# Patient Record
Sex: Female | Born: 1940
Health system: Southern US, Community
[De-identification: ages and names within clinical notes are randomized; demographics above are authoritative.]

## PROBLEM LIST (undated history)

## (undated) DIAGNOSIS — M5430 Sciatica, unspecified side: Secondary | ICD-10-CM

## (undated) DIAGNOSIS — K219 Gastro-esophageal reflux disease without esophagitis: Secondary | ICD-10-CM

## (undated) DIAGNOSIS — H269 Unspecified cataract: Secondary | ICD-10-CM

## (undated) DIAGNOSIS — K589 Irritable bowel syndrome without diarrhea: Secondary | ICD-10-CM

## (undated) DIAGNOSIS — T7840XA Allergy, unspecified, initial encounter: Secondary | ICD-10-CM

## (undated) DIAGNOSIS — M8580 Other specified disorders of bone density and structure, unspecified site: Secondary | ICD-10-CM

## (undated) DIAGNOSIS — G459 Transient cerebral ischemic attack, unspecified: Secondary | ICD-10-CM

## (undated) DIAGNOSIS — S92491D Other fracture of right great toe, subsequent encounter for fracture with routine healing: Secondary | ICD-10-CM

## (undated) DIAGNOSIS — H579 Unspecified disorder of eye and adnexa: Secondary | ICD-10-CM

## (undated) DIAGNOSIS — E785 Hyperlipidemia, unspecified: Secondary | ICD-10-CM

## (undated) DIAGNOSIS — M1990 Unspecified osteoarthritis, unspecified site: Secondary | ICD-10-CM

## (undated) DIAGNOSIS — F419 Anxiety disorder, unspecified: Secondary | ICD-10-CM

## (undated) DIAGNOSIS — K635 Polyp of colon: Secondary | ICD-10-CM

## (undated) DIAGNOSIS — I10 Essential (primary) hypertension: Secondary | ICD-10-CM

## (undated) HISTORY — DX: Other fracture of right great toe, subsequent encounter for fracture with routine healing: S92.491D

## (undated) HISTORY — DX: Hyperlipidemia, unspecified: E78.5

## (undated) HISTORY — DX: Allergy, unspecified, initial encounter: T78.40XA

## (undated) HISTORY — PX: ENDOMETRIAL ABLATION: SHX621

## (undated) HISTORY — DX: Gastro-esophageal reflux disease without esophagitis: K21.9

## (undated) HISTORY — DX: Essential (primary) hypertension: I10

## (undated) HISTORY — DX: Polyp of colon: K63.5

## (undated) HISTORY — DX: Sciatica, unspecified side: M54.30

## (undated) HISTORY — PX: POLYPECTOMY: SHX149

## (undated) HISTORY — DX: Other specified disorders of bone density and structure, unspecified site: M85.80

## (undated) HISTORY — DX: Unspecified osteoarthritis, unspecified site: M19.90

## (undated) HISTORY — DX: Anxiety disorder, unspecified: F41.9

## (undated) HISTORY — DX: Transient cerebral ischemic attack, unspecified: G45.9

## (undated) HISTORY — PX: APPENDECTOMY: SHX54

## (undated) HISTORY — DX: Irritable bowel syndrome, unspecified: K58.9

## (undated) HISTORY — PX: DILATION AND CURETTAGE OF UTERUS: SHX78

## (undated) HISTORY — PX: TONSILLECTOMY: SUR1361

## (undated) HISTORY — DX: Unspecified disorder of eye and adnexa: H57.9

## (undated) HISTORY — DX: Unspecified cataract: H26.9

## (undated) HISTORY — PX: COLONOSCOPY: SHX174

## (undated) HISTORY — PX: EXPLORATORY LAPAROTOMY: SUR591

---

## 1998-06-13 ENCOUNTER — Other Ambulatory Visit: Admission: RE | Admit: 1998-06-13 | Discharge: 1998-06-13 | Payer: Self-pay | Admitting: Obstetrics and Gynecology

## 1998-12-25 ENCOUNTER — Other Ambulatory Visit: Admission: RE | Admit: 1998-12-25 | Discharge: 1998-12-25 | Payer: Self-pay | Admitting: Obstetrics and Gynecology

## 1999-09-06 ENCOUNTER — Other Ambulatory Visit: Admission: RE | Admit: 1999-09-06 | Discharge: 1999-09-06 | Payer: Self-pay | Admitting: Obstetrics and Gynecology

## 2000-09-10 ENCOUNTER — Other Ambulatory Visit: Admission: RE | Admit: 2000-09-10 | Discharge: 2000-09-10 | Payer: Self-pay | Admitting: Obstetrics and Gynecology

## 2001-05-14 ENCOUNTER — Ambulatory Visit (HOSPITAL_COMMUNITY): Admission: RE | Admit: 2001-05-14 | Discharge: 2001-05-14 | Payer: Self-pay | Admitting: Obstetrics and Gynecology

## 2001-05-14 ENCOUNTER — Encounter (INDEPENDENT_AMBULATORY_CARE_PROVIDER_SITE_OTHER): Payer: Self-pay | Admitting: *Deleted

## 2001-12-10 ENCOUNTER — Emergency Department (HOSPITAL_COMMUNITY): Admission: EM | Admit: 2001-12-10 | Discharge: 2001-12-10 | Payer: Self-pay | Admitting: Emergency Medicine

## 2001-12-10 ENCOUNTER — Encounter: Payer: Self-pay | Admitting: Emergency Medicine

## 2001-12-10 ENCOUNTER — Encounter (INDEPENDENT_AMBULATORY_CARE_PROVIDER_SITE_OTHER): Payer: Self-pay | Admitting: *Deleted

## 2002-01-18 ENCOUNTER — Other Ambulatory Visit: Admission: RE | Admit: 2002-01-18 | Discharge: 2002-01-18 | Payer: Self-pay | Admitting: Internal Medicine

## 2003-03-31 ENCOUNTER — Other Ambulatory Visit: Admission: RE | Admit: 2003-03-31 | Discharge: 2003-03-31 | Payer: Self-pay | Admitting: Obstetrics and Gynecology

## 2004-04-02 ENCOUNTER — Other Ambulatory Visit: Admission: RE | Admit: 2004-04-02 | Discharge: 2004-04-02 | Payer: Self-pay | Admitting: Obstetrics and Gynecology

## 2005-03-12 ENCOUNTER — Ambulatory Visit: Payer: Self-pay | Admitting: Internal Medicine

## 2005-04-29 ENCOUNTER — Ambulatory Visit: Payer: Self-pay | Admitting: Internal Medicine

## 2006-03-19 ENCOUNTER — Other Ambulatory Visit: Admission: RE | Admit: 2006-03-19 | Discharge: 2006-03-19 | Payer: Self-pay | Admitting: Obstetrics and Gynecology

## 2007-07-22 ENCOUNTER — Other Ambulatory Visit: Admission: RE | Admit: 2007-07-22 | Discharge: 2007-07-22 | Payer: Self-pay | Admitting: Obstetrics and Gynecology

## 2008-10-10 ENCOUNTER — Other Ambulatory Visit: Admission: RE | Admit: 2008-10-10 | Discharge: 2008-10-10 | Payer: Self-pay | Admitting: Obstetrics and Gynecology

## 2008-12-12 ENCOUNTER — Encounter: Payer: Self-pay | Admitting: Obstetrics and Gynecology

## 2008-12-12 ENCOUNTER — Ambulatory Visit (HOSPITAL_COMMUNITY): Admission: RE | Admit: 2008-12-12 | Discharge: 2008-12-12 | Payer: Self-pay | Admitting: Obstetrics and Gynecology

## 2009-01-27 ENCOUNTER — Encounter (INDEPENDENT_AMBULATORY_CARE_PROVIDER_SITE_OTHER): Payer: Self-pay | Admitting: *Deleted

## 2009-05-02 ENCOUNTER — Ambulatory Visit: Payer: Self-pay | Admitting: Internal Medicine

## 2009-05-16 ENCOUNTER — Ambulatory Visit: Payer: Self-pay | Admitting: Internal Medicine

## 2009-12-04 ENCOUNTER — Ambulatory Visit (HOSPITAL_BASED_OUTPATIENT_CLINIC_OR_DEPARTMENT_OTHER): Admission: RE | Admit: 2009-12-04 | Discharge: 2009-12-04 | Payer: Self-pay | Admitting: Gynecology

## 2010-06-11 ENCOUNTER — Telehealth: Payer: Self-pay | Admitting: Internal Medicine

## 2010-06-20 ENCOUNTER — Telehealth: Payer: Self-pay | Admitting: Internal Medicine

## 2010-07-05 ENCOUNTER — Encounter: Payer: Self-pay | Admitting: Internal Medicine

## 2010-07-24 DIAGNOSIS — K589 Irritable bowel syndrome without diarrhea: Secondary | ICD-10-CM | POA: Insufficient documentation

## 2010-07-24 DIAGNOSIS — Z860100 Personal history of colon polyps, unspecified: Secondary | ICD-10-CM | POA: Insufficient documentation

## 2010-07-24 DIAGNOSIS — Z862 Personal history of diseases of the blood and blood-forming organs and certain disorders involving the immune mechanism: Secondary | ICD-10-CM

## 2010-07-24 DIAGNOSIS — N809 Endometriosis, unspecified: Secondary | ICD-10-CM | POA: Insufficient documentation

## 2010-07-24 DIAGNOSIS — Z8669 Personal history of other diseases of the nervous system and sense organs: Secondary | ICD-10-CM

## 2010-07-24 DIAGNOSIS — Z872 Personal history of diseases of the skin and subcutaneous tissue: Secondary | ICD-10-CM | POA: Insufficient documentation

## 2010-07-24 DIAGNOSIS — E785 Hyperlipidemia, unspecified: Secondary | ICD-10-CM | POA: Insufficient documentation

## 2010-08-02 ENCOUNTER — Ambulatory Visit: Payer: Self-pay | Admitting: Internal Medicine

## 2010-10-04 NOTE — Progress Notes (Signed)
Summary: Gastroenterology  Gastroenterology   Imported By: Lamona Curl CMA (AAMA) 07/24/2010 15:35:46  _____________________________________________________________________  External Attachment:    Type:   Image     Comment:   External Document

## 2010-10-04 NOTE — Progress Notes (Signed)
Summary: work-in request  Medications Added ANUSOL-HC 25 MG SUPP (HYDROCORTISONE ACETATE) 1 pr two times a day for 1 week then q hs       Phone Note Call from Patient Call back at [facility] Phone [phone]   Caller: Patient Call For: Dr. Juanda Chance Reason for Call: Talk to Nurse Summary of Call: pt asked tp sch an appt for somerectal bleeding and rectal discomfort... pt fairly sure this from hemorrhoids... offered next available in December... pt said this was too long to wait... told pt she could sch and then call back to check for cancellations... pt said no and that she wanted a mesg sent to [facility]... verified again with pt severity of symptoms and told her that we may not be able to work herinfor these symptoms... pt said she still wanted Lavonna Rua ito work her in Initial call taken by: Vallarie Mare,  June 20, 2010 2:03 PM  Follow-up for Phone Call        hydrocortisone cream not helping she is requesting some suppositories until her appt .  Dr Juanda Chance ok to send her in some anusol?  She wanted to come in for an appointment.  She is scheduled for 08/02/10 8:30.  Patient is again advised to continue the rectal cream three times a day and as needed, sitz baths and Miralax.  Please advise if ok to send in suppositories Follow-up by: Darcey Nora RN, CGRN,  June 20, 2010 2:28 PM  Additional Follow-up for Phone Call Additional follow up Details #1::        OK to send supp.  Anusol HC, #12, 1bid x 1 week then 1 hs, 1 refill Additional Follow-up by: Hart Carwin MD,  June 20, 2010 3:14 PM    Additional Follow-up for Phone Call Additional follow up Details #2::    patient aware of Dr Regino Schultze recommendations Follow-up by: Darcey Nora RN, CGRN,  June 20, 2010 3:40 PM  New/Updated Medications: ANUSOL-HC 25 MG SUPP (HYDROCORTISONE ACETATE) 1 pr two times a day for 1 week then q hs Prescriptions: ANUSOL-HC 25 MG SUPP (HYDROCORTISONE ACETATE) 1 pr two times a day for 1 week then q hs  #12  x 1   Entered by:   Darcey Nora RN, CGRN   Authorized by:   Hart Carwin MD   Signed by:   Darcey Nora RN, CGRN on 06/20/2010   Method used:   Electronically to        [facility]* (retail)       [address]       [place], [place]  [id number]       Ph: [phone]       Fax: [phone]   RxID:   [id number]   Appended Document: work-in request    Clinical Lists Changes  Medications: Rx of ANUSOL-HC 25 MG SUPP (HYDROCORTISONE ACETATE) 1 pr two times a day for 1 week then q hs;  #12 x 1;  Signed;  Entered by: Darcey Nora RN, CGRN;  Authorized by: Hart Carwin MD;  Method used: Electronically to [facility]*, [address], [place], [place]  [id number], Ph: [phone], Fax: [phone]    Prescriptions: ANUSOL-HC 25 MG SUPP (HYDROCORTISONE ACETATE) 1 pr two times a day for 1 week then q hs  #12 x 1   Entered by:   Darcey Nora RN, CGRN   Authorized by:   Hart Carwin MD   Signed by:   Darcey Nora RN, CGRN  on 06/21/2010   Method used:   Electronically to        [facility]* (retail)       [address]       [place], [place]  [id number]       Ph: [phone]       Fax: [phone]   RxID:   [id number]

## 2010-10-04 NOTE — Assessment & Plan Note (Signed)
Summary: hemprrhoids, rectal bleeding/sheri    History of Present Illness Visit Type: Follow-up Visit Primary GI MD: Lina Sar MD Primary Gustavia Carie: Annalee Genta, MD Chief Complaint: rectal bleeding x 2 is not having now History of Present Illness:   This is a 70 year old white female with a history of an anal fissure in 2006 .She had a colonoscopy in September 2010. She has a history of a tubular adenoma of the colon in 1990 and in 2005. She developed rectal bleeding on toilet tissue and rectal pain which lasted about 6 weeks. Dr. Valentina Lucks has seen her and treated her with ProctoFoam with good results. She is about 80% improved. She has a tendency towards constipation. Her laxative regimen includes prune juice daily, Metamucil daily and probiotic. She is having 3 bowel movements a day. Her complaint is a feeling of incomplete evacuation and difficulty with straining.Narrow stools   GI Review of Systems      Denies abdominal pain, acid reflux, belching, bloating, chest pain, dysphagia with liquids, dysphagia with solids, heartburn, loss of appetite, nausea, vomiting, vomiting blood, weight loss, and  weight gain.      Reports change in bowel habits, rectal bleeding, and  rectal pain.     Denies anal fissure, black tarry stools, constipation, diarrhea, diverticulosis, fecal incontinence, heme positive stool, hemorrhoids, irritable bowel syndrome, jaundice, light color stool, and  liver problems. Preventive Screening-Counseling & Management  Alcohol-Tobacco     Smoking Status: never    Current Medications (verified): 1)  Nexium 40 Mg  Cpdr (Esomeprazole Magnesium) .Marland Kitchen.. 1 Capsule Each Day 30 Minutes Before Meals As Needed 2)  Anusol-Hc 25 Mg Supp (Hydrocortisone Acetate) .Marland Kitchen.. 1 Pr Two Times A Day For 1 Week Then Q Hs 3)  Flaxseed Oil 1000 Mg Caps (Flaxseed (Linseed)) .Marland Kitchen.. 1 By Mouth Once Daily 4)  Fish Oil Maximum Strength 1200 Mg Caps (Omega-3 Fatty Acids) .Marland Kitchen.. 1 By Mouth Two Times A  Day 5)  Vitamin B-6 100 Mg Tabs (Pyridoxine Hcl) .Marland Kitchen.. 1 By Mouth Once Daily 6)  Citrucel 500 Mg Tabs (Methylcellulose (Laxative)) .Marland Kitchen.. 1 By Mouth Every Other Day 7)  Triamterene-Hctz 37.5-25 Mg Tabs (Triamterene-Hctz) .Marland Kitchen.. 1 By Mouth Once Daily 8)  Advil 200 Mg Tabs (Ibuprofen) .Marland Kitchen.. 1 By Mouth As Needed  Allergies (verified): 1)  ! * "statins"  Past History:  Past Medical History: Reviewed history from 07/24/2010 and no changes required. Current Problems:  Hx of ENDOMETRIOSIS (ICD-617.9) COLONIC POLYPS, ADENOMATOUS, HX OF (ICD-V12.72) IRRITABLE BOWEL SYNDROME (ICD-564.1) ANAL FISSURE, HX OF (ICD-V13.3)  Hx rectocele  Past Surgical History: Reviewed history from 07/24/2010 and no changes required. D & C Tonsillectomy Wisdom Tooth Extraction Exploratory Surgery w/ appendectomy  Family History: Reviewed history from 07/24/2010 and no changes required. Family History of Breast Cancer:Paternal Aunt? Family History of Diabetes: Father Family History of Heart Disease: Father, MGM Family History of Kidney Disease: Father Family History of Pancreatic Cancer: PGM No FH of Colon Cancer:  Social History: Reviewed history from 07/24/2010 and no changes required. Alcohol Use - yes-occasional Illicit Drug Use - no Patient has never smoked.  Occupation: Retired Smoking Status:  never  Review of Systems  The patient denies allergy/sinus, anemia, anxiety-new, arthritis/joint pain, back pain, blood in urine, breast changes/lumps, change in vision, confusion, cough, coughing up blood, depression-new, fainting, fatigue, fever, headaches-new, hearing problems, heart murmur, heart rhythm changes, itching, menstrual pain, muscle pains/cramps, night sweats, nosebleeds, pregnancy symptoms, shortness of breath, skin rash, sleeping problems, sore throat, swelling of feet/legs, swollen  lymph glands, thirst - excessive , urination - excessive , urination changes/pain, urine leakage, vision changes,  and voice change.         Pertinent positive and negative review of systems were noted in the above HPI. All other ROS was otherwise negative.   Vital Signs:  Patient profile:   70 year old female Height:      63.5 inches Weight:      251 pounds BMI:     43.92 Pulse rate:   84 / minute Pulse rhythm:   regular BP sitting:   144 / 74  (left arm)  Vitals Entered By: Milford Cage NCMA (August 02, 2010 8:41 AM)  Physical Exam  General:  overweight, alert and oriented. Neck:  Supple; no masses or thyromegaly. Lungs:  Clear throughout to auscultation. Heart:  Regular rate and rhythm; no murmurs, rubs,  or bruits. Abdomen:  Soft, nontender and nondistended. No masses, hepatosplenomegaly or hernias noted. Normal bowel sounds. Rectal:  normal perianal area. Tender anal canal, increased sphincter tone. Discomfort with insertion of the anoscope. No definite fissure was seen today. There was no blood. Stool was Hemoccult negative. There were no significant hemorrhoids. Extremities:  No clubbing, cyanosis, edema or deformities noted.   Impression & Recommendations:  Problem # 1:  ANAL FISSURE, HX OF (ICD-V13.3) Patient has had recent onset rectal pain and bleeding consistent with a recurrent anal fissure now resolved after a course of ProctoFoam. We will give her a prescription for Analpram cream 2.5% to use during the day 2-3 times a week for maintenance. She will continue on her regimen of prune juice, Metamucil one heaping teaspoon a day and a probiotic.  Problem # 2:  COLONIC POLYPS, ADENOMATOUS, HX OF (ICD-V12.72) No polyps found  on  last colonoscopy in September 2010. A recall colonoscopy will be due in September 2020.  Patient Instructions: 1)  Please pick up your prescriptions at the pharmacy. Electronic prescription(s) has already been sent for Analpram cream 2.5%, 2)  Continue prune juice 4 ounces daily, Metamucil one heaping teaspoon daily. 3)  Probiotic daily. May use stool  softener p.r.n. 4)  Recall colonoscopy September 2020. 5)  Copy sent to : [facility] 6)  The medication list was reviewed and reconciled.  All changed / newly prescribed medications were explained.  A complete medication list was provided to the patient / caregiver. Prescriptions: ANALPRAM-HC 1-2.5 % CREA (HYDROCORTISONE ACE-PRAMOXINE) Apply to rectum three times per week  #30 grams x 0   Entered by:   Lamona Curl CMA (AAMA)   Authorized by:   Hart Carwin MD   Signed by:   Lamona Curl CMA (AAMA) on 08/02/2010   Method used:   Electronically to        [facility]* (retail)       [address]       [place], [place]  [id number]       Ph: [phone]       Fax: [phone]   RxID:   [id number]

## 2010-10-04 NOTE — Progress Notes (Signed)
Summary: med request   Phone Note Call from Patient Call back at Home Phone [phone]   Caller: Patient Call For: Dr. Juanda Chance Reason for Call: Talk to Nurse Summary of Call: having hemorrhoid problems.. Initial call taken by: Vallarie Mare,  June 11, 2010 2:33 PM  Follow-up for Phone Call        Patient  had hard stool about 2 weeks ago with one episode of BRRB on tissue only.  She has some rectal irritation.  Pt advised to soak in tub of warm water/sitz bath 10 minutes BID, Tucs pads, meticulous cleaning between bowel movements. Patient is also advised can try hydrocortisone cream to rectal area three times a day and after BM.  She c/o hard stools she is advised to try Miralax or stool softener.  She is asked to call back in 1-2 weeks if she is not any better. Follow-up by: Darcey Nora RN, CGRN,  June 11, 2010 4:06 PM  Additional Follow-up for Phone Call Additional follow up Details #1::        reviewed and agree.  Additional Follow-up by: Hart Carwin MD,  June 11, 2010 10:53 PM

## 2010-10-06 ENCOUNTER — Emergency Department (HOSPITAL_COMMUNITY)
Admission: EM | Admit: 2010-10-06 | Discharge: 2010-10-06 | Disposition: A | Discharge status: Home / Self Care | Payer: Medicare Other | Attending: Emergency Medicine | Admitting: Emergency Medicine

## 2010-10-06 ENCOUNTER — Emergency Department (HOSPITAL_COMMUNITY): Payer: Medicare Other

## 2010-10-06 ENCOUNTER — Emergency Department (HOSPITAL_COMMUNITY): Payer: BLUE CROSS/BLUE SHIELD

## 2010-10-06 DIAGNOSIS — I10 Essential (primary) hypertension: Secondary | ICD-10-CM | POA: Insufficient documentation

## 2010-10-06 DIAGNOSIS — R609 Edema, unspecified: Secondary | ICD-10-CM | POA: Insufficient documentation

## 2010-10-06 DIAGNOSIS — IMO0002 Reserved for concepts with insufficient information to code with codable children: Secondary | ICD-10-CM | POA: Insufficient documentation

## 2010-10-06 DIAGNOSIS — M1710 Unilateral primary osteoarthritis, unspecified knee: Secondary | ICD-10-CM | POA: Insufficient documentation

## 2010-11-21 LAB — POCT I-STAT 4, (NA,K, GLUC, HGB,HCT)
Glucose, Bld: 115 mg/dL — ABNORMAL HIGH (ref 70–99)
HCT: 41 % (ref 36.0–46.0)
Hemoglobin: 13.9 g/dL (ref 12.0–15.0)
Potassium: 3.2 mEq/L — ABNORMAL LOW (ref 3.5–5.1)
Sodium: 141 mEq/L (ref 135–145)

## 2010-12-12 LAB — BASIC METABOLIC PANEL
CO2: 28 mEq/L (ref 19–32)
Chloride: 104 mEq/L (ref 96–112)
GFR calc Af Amer: >60 mL/min (ref >60)
Potassium: 4.2 mEq/L (ref 3.5–5.1)
Sodium: 139 mEq/L (ref 135–145)

## 2010-12-12 LAB — URINE MICROSCOPIC-ADD ON

## 2010-12-12 LAB — URINALYSIS, ROUTINE W REFLEX MICROSCOPIC
Glucose, UA: NEGATIVE mg/dL
Leukocytes, UA: NEGATIVE
Specific Gravity, Urine: 1.015 (ref 1.005–1.030)

## 2010-12-12 LAB — CBC
Platelets: 225 10*3/uL (ref 150–400)
RBC: 4.72 MIL/uL (ref 3.87–5.11)
WBC: 7.9 10*3/uL (ref 4.0–10.5)

## 2011-01-15 ENCOUNTER — Ambulatory Visit (HOSPITAL_COMMUNITY)
Admission: RE | Admit: 2011-01-15 | Discharge: 2011-01-15 | Disposition: A | Discharge status: Home / Self Care | Payer: Medicare Other | Source: Ambulatory Visit | Attending: Family Medicine | Admitting: Family Medicine

## 2011-01-15 DIAGNOSIS — I82819 Embolism and thrombosis of superficial veins of unspecified lower extremities: Secondary | ICD-10-CM | POA: Insufficient documentation

## 2011-01-15 DIAGNOSIS — I8000 Phlebitis and thrombophlebitis of superficial vessels of unspecified lower extremity: Secondary | ICD-10-CM | POA: Insufficient documentation

## 2011-01-15 DIAGNOSIS — M7989 Other specified soft tissue disorders: Secondary | ICD-10-CM

## 2011-01-15 NOTE — Op Note (Signed)
NAME:  Amber Sims, Amber Sims NO.:  [id number]   MEDICAL RECORD NO.:  [id number]          PATIENT TYPE:  AMB   LOCATION:  SDC                           FACILITY:  [facility]   PHYSICIAN:  Cynthia P. Romine, M.D.DATE OF BIRTH:  [birth year]   DATE OF PROCEDURE:  DATE OF DISCHARGE:                               OPERATIVE REPORT   PREOPERATIVE DIAGNOSES:  Recurrent postmenopausal bleeding, office  endometrial biopsy showing hyperplasia without atypia.   POSTOPERATIVE DIAGNOSES:  Recurrent postmenopausal bleeding, office  endometrial biopsy showing hyperplasia without atypia.   PATHOLOGY:  Pending.   PROCEDURE:  Hysteroscopy, D and C, hysteroscopic resection of  endometrial polyp.   SURGEON:  Cynthia P. Romine, MD   ANESTHESIA:  General by LMA.   ESTIMATED BLOOD LOSS:  Minimal.   SORBITOL DEFICIT:  100 mL.   COMPLICATIONS:  None.   PROCEDURE:  The patient was taken to the operating room and after  induction of adequate general anesthesia by LMA, was placed in dorsal  lithotomy position and prepped and draped in the usual fashion.  The  bladder was drained with a red rubber catheter.  Posterior weighted and  anterior Sims retractor placed.  The cervix was grasped on its anterior  lip with a single-toothed tenaculum.  A paracervical block was  instituted by injecting 10 mL of 1% Xylocaine at each of 3 and 9  o'clock.  Uterus sounded to 10 cm.  The uterus was dilated to #27 Shawnie Pons.  The diagnostic hysteroscope was introduced.  The endometrium appeared  for the most part atrophic; however, there was a polyp in the right  cornu.  Photographic documentation was taken.  The scope was withdrawn.  The polyp forceps were introduced and an attempt was made to remove the  polyp with polyp forceps, when that failed a gentle sharp curettage was  done with a #1 curette.  The diagnostic scope was reintroduced and the  polyp was still present.  Therefore, resection of the polyp with  cautery  was done, the diagnostic scope was withdrawn.  The cervix was dilated to  a 31 Pratt.  A second tenaculum was placed on the posterior lips since  the uterus was so anteflexed to try and straighten the axis of the  uterus.  The operative scope was introduced into the uterus.  Single  loop cautery was used to remove the polyp in the right cornu and clean  out some shaggy endometrium in several other places.  The  scope was then withdrawn.  The cavity appeared clear.  The specimens  were combined between the curettings and the resection and sent to  pathology.  The instruments removed from the vagina and the procedure  terminated.  The patient tolerated it well, went in satisfactory  condition to post anesthesia recovery.      Cynthia P. Romine, M.D.  Electronically Signed     CPR/MEDQ  D:  12/12/2008  T:  12/13/2008  Job:  [id number]

## 2011-01-18 NOTE — Op Note (Signed)
[facility]  Patient:    ZEPPELIN, BECKSTRAND Visit Number: [id number] MRN: [id number]          Service Type: DSU Location: [facility] Attending Physician:  Sharon Mt Dictated by:   Rande Brunt. Eda Paschal, M.D. Proc. Date: 05/14/01 Admit Date:  05/14/2001                             Operative Report  PREOPERATIVE DIAGNOSIS:       Post menopausal bleeding with probable fragments                               and endometrial polyp remaining.  POSTOPERATIVE DIAGNOSIS:      Post menopausal bleeding with fragments of                               endometrial polyp remaining.  OPERATION:                    Hysteroscopy, D&C.  SURGEON:                      Daniel L. Eda Paschal, M.D.  ASSISTANT:  ANESTHESIA:                   General  ESTIMATED BLOOD LOSS:  INDICATIONS:                  The patient is a 70 year old female who came to my office with an episode of post menopausal bleeding not on HRT.  Endometrial biopsy was done. It revealed fragments of an endometrial polyp along with hyperplasia.  She now enters the hospital for hysteroscopy to be sure that that there still is not more polyp present and to be sure that the worse area has been sampled since there was hyperplasia present.  FINDINGS:                     External vaginal is within normal limits. Cervix is clean.  Uterus is normal size and shape.  Adnexa revealed no masses.  There is no uterine descensus. At the time of hysteroscopy, there were small fragments of an endometrial polyp on the left lateral wall.  Other than this, the cavity was completely free of any disease except for the fact that it had sort of an irregular appearance as of it was disrupted by a myoma posteriorly.  The irregular appearance was just on the posterior wall.  Other than the above, hysteroscopic findings were normal.  DESCRIPTION OF PROCEDURE:     The patient was taken to the operating room, placed in  the dorsal lithotomy position, prepped and draped in the usual sterile manner.  Initially an attempt was made to do this procedure with just local.  She was given 20 cc of 1% plain xylocaine paracervical block and IV sedation.  Because of her anatomy it was difficult to get into the cervix and dilate her and exposure was difficult as well and finally decision was made to give her general for additional relaxation which worked Agricultural consultant. After this, her cervix could be dilated to a #27 Pratt dilator.  Diagnostic hysteroscopy was performed using 3% sorbitol to expand the intrauterine cavity, using a camera for magnification.  The above findings were noted. There were some  fragments of a polyp in the left lateral wall.  The posterior wall of the uterus did have an irregular shape but nothing that was actually in the cavity and other than this, everything was normal.  Sharp curettage was done with three different size curets and the patient was rehysteroscoped and the fragments had polyp had been successfully removed with a curet.  All tissue was sent to pathology for tissue diagnosis.  Estimated blood loss for the entire procedure was less than 50 cc with none replaced.  Fluid deficit was 50 cc.  The patient tolerated the procedure well and left the operating room in satisfactory condition. Dictated by:   Rande Brunt. Eda Paschal, M.D. Attending Physician:  Sharon Mt DD:  05/14/01 TD:  05/14/01 Job: [id number]

## 2012-07-29 ENCOUNTER — Ambulatory Visit: Payer: PRIVATE HEALTH INSURANCE | Attending: Internal Medicine | Admitting: Physical Therapy

## 2012-07-29 DIAGNOSIS — IMO0001 Reserved for inherently not codable concepts without codable children: Secondary | ICD-10-CM | POA: Insufficient documentation

## 2012-07-29 DIAGNOSIS — M25559 Pain in unspecified hip: Secondary | ICD-10-CM | POA: Insufficient documentation

## 2012-07-29 DIAGNOSIS — R293 Abnormal posture: Secondary | ICD-10-CM | POA: Insufficient documentation

## 2012-08-03 ENCOUNTER — Ambulatory Visit
Payer: PRIVATE HEALTH INSURANCE | Attending: Internal Medicine | Admitting: Rehabilitative and Restorative Service Providers"

## 2012-08-03 DIAGNOSIS — IMO0001 Reserved for inherently not codable concepts without codable children: Secondary | ICD-10-CM | POA: Insufficient documentation

## 2012-08-03 DIAGNOSIS — M25559 Pain in unspecified hip: Secondary | ICD-10-CM | POA: Insufficient documentation

## 2012-08-03 DIAGNOSIS — R293 Abnormal posture: Secondary | ICD-10-CM | POA: Insufficient documentation

## 2012-08-05 ENCOUNTER — Ambulatory Visit: Payer: PRIVATE HEALTH INSURANCE | Admitting: Physical Therapy

## 2012-08-11 ENCOUNTER — Ambulatory Visit: Payer: PRIVATE HEALTH INSURANCE | Admitting: Physical Therapy

## 2012-08-13 ENCOUNTER — Ambulatory Visit: Payer: PRIVATE HEALTH INSURANCE | Admitting: Physical Therapy

## 2012-08-17 ENCOUNTER — Ambulatory Visit: Payer: PRIVATE HEALTH INSURANCE | Admitting: Physical Therapy

## 2012-08-19 ENCOUNTER — Ambulatory Visit: Payer: PRIVATE HEALTH INSURANCE | Admitting: Physical Therapy

## 2012-09-03 ENCOUNTER — Ambulatory Visit: Payer: PRIVATE HEALTH INSURANCE | Attending: Internal Medicine | Admitting: Physical Therapy

## 2012-09-03 DIAGNOSIS — M25559 Pain in unspecified hip: Secondary | ICD-10-CM | POA: Insufficient documentation

## 2012-09-03 DIAGNOSIS — R293 Abnormal posture: Secondary | ICD-10-CM | POA: Insufficient documentation

## 2012-09-03 DIAGNOSIS — IMO0001 Reserved for inherently not codable concepts without codable children: Secondary | ICD-10-CM | POA: Insufficient documentation

## 2012-09-08 ENCOUNTER — Ambulatory Visit: Payer: PRIVATE HEALTH INSURANCE | Admitting: Physical Therapy

## 2012-09-10 ENCOUNTER — Ambulatory Visit: Payer: PRIVATE HEALTH INSURANCE | Admitting: Physical Therapy

## 2012-10-01 ENCOUNTER — Ambulatory Visit: Payer: PRIVATE HEALTH INSURANCE

## 2012-10-06 ENCOUNTER — Ambulatory Visit: Payer: PRIVATE HEALTH INSURANCE | Admitting: Rehabilitative and Restorative Service Providers"

## 2013-01-13 ENCOUNTER — Other Ambulatory Visit: Payer: Self-pay | Admitting: Internal Medicine

## 2013-01-13 DIAGNOSIS — G8929 Other chronic pain: Secondary | ICD-10-CM

## 2013-01-15 ENCOUNTER — Other Ambulatory Visit: Payer: No Typology Code available for payment source

## 2013-01-27 ENCOUNTER — Other Ambulatory Visit: Payer: No Typology Code available for payment source

## 2013-02-06 ENCOUNTER — Ambulatory Visit
Admission: RE | Admit: 2013-02-06 | Discharge: 2013-02-06 | Disposition: A | Discharge status: Home / Self Care | Payer: PRIVATE HEALTH INSURANCE | Source: Ambulatory Visit | Attending: Internal Medicine | Admitting: Internal Medicine

## 2013-02-06 DIAGNOSIS — G8929 Other chronic pain: Secondary | ICD-10-CM

## 2013-10-09 ENCOUNTER — Encounter: Payer: Self-pay | Admitting: *Deleted

## 2014-01-13 ENCOUNTER — Other Ambulatory Visit: Payer: Self-pay | Admitting: Dermatology

## 2014-03-10 ENCOUNTER — Ambulatory Visit: Payer: Self-pay | Admitting: Gynecology

## 2015-02-06 ENCOUNTER — Other Ambulatory Visit: Payer: Self-pay | Admitting: Internal Medicine

## 2015-02-06 DIAGNOSIS — R946 Abnormal results of thyroid function studies: Secondary | ICD-10-CM

## 2015-02-08 ENCOUNTER — Ambulatory Visit
Admission: RE | Admit: 2015-02-08 | Discharge: 2015-02-08 | Disposition: A | Discharge status: Home / Self Care | Payer: Medicare Other | Source: Ambulatory Visit | Attending: Internal Medicine | Admitting: Internal Medicine

## 2015-02-08 DIAGNOSIS — R946 Abnormal results of thyroid function studies: Secondary | ICD-10-CM

## 2015-02-14 ENCOUNTER — Other Ambulatory Visit: Payer: Self-pay | Admitting: Internal Medicine

## 2015-02-14 DIAGNOSIS — E041 Nontoxic single thyroid nodule: Secondary | ICD-10-CM

## 2015-03-14 ENCOUNTER — Ambulatory Visit
Admission: RE | Admit: 2015-03-14 | Discharge: 2015-03-14 | Disposition: A | Discharge status: Home / Self Care | Payer: Medicare Other | Source: Ambulatory Visit | Attending: Internal Medicine | Admitting: Internal Medicine

## 2015-03-14 ENCOUNTER — Other Ambulatory Visit (HOSPITAL_COMMUNITY)
Admission: RE | Admit: 2015-03-14 | Discharge: 2015-03-14 | Disposition: A | Discharge status: Home / Self Care | Payer: Medicare Other | Source: Ambulatory Visit | Attending: Interventional Radiology | Admitting: Interventional Radiology

## 2015-03-14 DIAGNOSIS — E041 Nontoxic single thyroid nodule: Secondary | ICD-10-CM | POA: Diagnosis present

## 2015-05-10 ENCOUNTER — Encounter: Payer: Self-pay | Admitting: Internal Medicine

## 2016-02-02 ENCOUNTER — Telehealth: Payer: Self-pay | Admitting: Internal Medicine

## 2016-02-02 NOTE — Telephone Encounter (Signed)
Left message for patient to return my call.

## 2016-02-02 NOTE — Telephone Encounter (Signed)
Absolutely. Thanks 

## 2016-02-08 ENCOUNTER — Other Ambulatory Visit: Payer: Self-pay | Admitting: Internal Medicine

## 2016-02-08 DIAGNOSIS — E041 Nontoxic single thyroid nodule: Secondary | ICD-10-CM

## 2016-03-13 ENCOUNTER — Encounter: Payer: Self-pay | Admitting: Internal Medicine

## 2016-03-13 ENCOUNTER — Ambulatory Visit
Admission: RE | Admit: 2016-03-13 | Discharge: 2016-03-13 | Disposition: A | Discharge status: Home / Self Care | Payer: Medicare Other | Source: Ambulatory Visit | Attending: Internal Medicine | Admitting: Internal Medicine

## 2016-03-13 DIAGNOSIS — E041 Nontoxic single thyroid nodule: Secondary | ICD-10-CM

## 2016-04-29 ENCOUNTER — Ambulatory Visit
Admission: RE | Admit: 2016-04-29 | Discharge: 2016-04-29 | Disposition: A | Discharge status: Home / Self Care | Payer: Medicare Other | Source: Ambulatory Visit | Attending: Nurse Practitioner | Admitting: Nurse Practitioner

## 2016-04-29 ENCOUNTER — Other Ambulatory Visit: Payer: Self-pay | Admitting: Nurse Practitioner

## 2016-04-29 DIAGNOSIS — R52 Pain, unspecified: Secondary | ICD-10-CM

## 2016-04-30 ENCOUNTER — Encounter: Payer: Self-pay | Admitting: Podiatry

## 2016-04-30 ENCOUNTER — Ambulatory Visit (INDEPENDENT_AMBULATORY_CARE_PROVIDER_SITE_OTHER): Payer: Medicare Other | Admitting: Podiatry

## 2016-04-30 VITALS — BP 125/64 | HR 73 | Temp 99.4°F | Resp 14

## 2016-04-30 DIAGNOSIS — S92911A Unspecified fracture of right toe(s), initial encounter for closed fracture: Secondary | ICD-10-CM

## 2016-04-30 NOTE — Patient Instructions (Signed)
Wear surgical shoe on the right foot 4-8 weeks until all pain and swelling ends Limit standing and walking Return for follow-up if symptoms are not gradually improving in next 4-6 weeks   Toe Fracture A toe fracture is a break in one of the toe bones (phalanges). CAUSES This condition may be caused by:  Dropping a heavy object on your toe.  Stubbing your toe.  Overusing your toe or doing repetitive exercise.  Twisting or stretching your toe out of place. RISK FACTORS This condition is more likely to develop in people who:  Play contact sports.  Have a bone disease.  Have a low calcium level. SYMPTOMS The main symptoms of this condition are swelling and pain in the toe. The pain may get worse with standing or walking. Other symptoms include:  Bruising.  Stiffness.  Numbness.  A change in the way the toe looks.  Broken bones that poke through the skin.  Blood beneath the toenail. DIAGNOSIS This condition is diagnosed with a physical exam. You may also have X-rays. TREATMENT  Treatment for this condition depends on the type of fracture and its severity. Treatment may involve:  Taping the broken toe to a toe that is next to it (buddy taping). This is the most common treatment for fractures in which the bone has not moved out of place (nondisplaced fracture).  Wearing a shoe that has a wide, rigid sole to protect the toe and to limit its movement.  Wearing a walking cast.  Having a procedure to move the toe back into place.  Surgery. This may be needed:  If there are many pieces of broken bone that are out of place (displaced).  If the toe joint breaks.  If the bone breaks through the skin.  Physical therapy. This is done to help regain movement and strength in the toe. You may need follow-up X-rays to make sure that the bone is healing well and staying in position. HOME CARE INSTRUCTIONS If You Have a Cast:  Do not stick anything inside the cast to scratch  your skin. Doing that increases your risk of infection.  Check the skin around the cast every day. Report any concerns to your health care provider. You may put lotion on dry skin around the edges of the cast. Do not apply lotion to the skin underneath the cast.  Do not put pressure on any part of the cast until it is fully hardened. This may take several hours.  Keep the cast clean and dry. Bathing  Do not take baths, swim, or use a hot tub until your health care provider approves. Ask your health care provider if you can take showers. You may only be allowed to take sponge baths for bathing.  If your health care provider approves bathing and showering, cover the cast or bandage (dressing) with a watertight plastic bag to protect it from water. Do not let the cast or dressing get wet. Managing Pain, Stiffness, and Swelling  If you do not have a cast, apply ice to the injured area, if directed.  Put ice in a plastic bag.  Place a towel between your skin and the bag.  Leave the ice on for 20 minutes, 2-3 times per day.  Move your toes often to avoid stiffness and to lessen swelling.  Raise (elevate) the injured area above the level of your heart while you are sitting or lying down. Driving  Do not drive or operate heavy machinery while taking pain medicine.  Do not drive while wearing a cast on a foot that you use for driving. Activity  Return to your normal activities as directed by your health care provider. Ask your health care provider what activities are safe for you.  Perform exercises daily as directed by your health care provider or physical therapist. Safety  Do not use the injured limb to support your body weight until your health care provider says that you can. Use crutches or other assistive devices as directed by your health care provider. General Instructions  If your toe was treated with buddy taping, follow your health care provider's instructions for changing  the gauze and tape. Change it more often:  The gauze and tape get wet. If this happens, dry the space between the toes.  The gauze and tape are too tight and cause your toe to become pale or numb.  Wear a protective shoe as directed by your health care provider. If you were not given a protective shoe, wear sturdy, supportive shoes. Your shoes should not pinch your toes and should not fit tightly against your toes.  Do not use any tobacco products, including cigarettes, chewing tobacco, or e-cigarettes. Tobacco can delay bone healing. If you need help quitting, ask your health care provider.  Take medicines only as directed by your health care provider.  Keep all follow-up visits as directed by your health care provider. This is important. SEEK MEDICAL CARE IF:  You have a fever.  Your pain medicine is not helping.  Your toe is cold.  Your toe is numb.  You still have pain after one week of rest and treatment.  You still have pain after your health care provider has said that you can start walking again.  You have pain, tingling, or numbness in your foot that is not going away. SEEK IMMEDIATE MEDICAL CARE IF:  You have severe pain.  You have redness or inflammation in your toe that is getting worse.  You have pain or numbness in your toe that is getting worse.  Your toe turns blue.   This information is not intended to replace advice given to you by your health care provider. Make sure you discuss any questions you have with your health care provider.   Document Released: 08/16/2000 Document Revised: 05/10/2015 Document Reviewed: 06/15/2014 Elsevier Interactive Patient Education [employer].

## 2016-04-30 NOTE — Progress Notes (Signed)
Patient ID: [name], female   DOB: 11-21-1940, 75 y.o.   MRN: [id number]

## 2016-04-30 NOTE — Progress Notes (Signed)
Patient ID: Amber Sims, female   DOB: October 05, 1940, 75 y.o.   MRN: [id number]   subjective: This patient presents today complaining of a painful swollen right great toe joint area describing an accidental injury whence tripping over her carpet at home on 04/26/2016. She describes immediate pain and swelling in the area after the injury. She visit her primary care physician approximately 04/29/2016 who prescribed hydrocodone 5/325 and ordered an x-ray. She presents today stating that the pain and swelling is actually increased since the injury. Patient's husband is present in the treatment room today   Review of systems positive for swelling in the right great toe joint  Objective: Orientated 3  Vascular: Edema dorsal plantar right foot DP and PT pulses 2/4 bilaterally Capillary reflex immediate bilaterally  Neurological: Sensation to 10 g monofilament wire intact 5/5 bilaterally Vibratory sensation intact bilaterally Ankle reflex equal and reactive bilaterally  Dermatological: No open skin lesions bilaterally Local edema and erythema dorsal right first MPJ Plantar edema right first MPJ  Musculoskeletal: Manual motor testing Dorsi flexion right 3/5 and 5/5 left Plantar flexion 3/5 right 5/5 left Tenderness to palpation right first MPJ with tenderness on range  Pain on range of motion first MPJ right  EXAM: RIGHT GREAT TOE  COMPARISON:  No recent prior.  FINDINGS: Subtle minimally displaced fracture along the medial base of the proximal phalanx of the right great toe noted. Fracture appears to extend into the joint space. Diffuse degenerative change.  IMPRESSION: Subtle minimally displaced fracture of the medial aspect of the base of the proximal phalanx the right great toe.   Electronically Signed   By: Marcello Moores  Register   On: 04/29/2016 15:47  I I reviewed the x-ray today and agree with the diagnosis  Assessment: Nondisplaced fracture medial base of  proximal phalanx right hallux Soft tissue injury right first MPJ  Plan: I reviewed the results of the x-ray exam with patient today. Ace wrap applied to right foot and ankle area Surgical shoe dispensed to wear in the right foot until all pain and swelling resolve  Reappoint at patient's request

## 2016-04-30 NOTE — Progress Notes (Signed)
   Subjective:    Patient ID: Amber Sims, female    DOB: 02-23-1941, 75 y.o.   MRN: [id number]  N-sharpe,tender to touch L- right foot, great toe D- hit it on thick area of the rug 3 days ago O -suddenly C- worse and swelling is worse A-touching or putting on shoes T- was given pain medicine (hydorcodon-Acetam) at Dr Delene Ruffini office        Review of Systems  All other systems reviewed and are negative.      Objective:   Physical Exam        Assessment & Plan:

## 2016-05-02 ENCOUNTER — Ambulatory Visit (AMBULATORY_SURGERY_CENTER): Payer: Self-pay | Admitting: *Deleted

## 2016-05-02 VITALS — Ht 63.5 in | Wt 230.0 lb

## 2016-05-02 DIAGNOSIS — Z86010 Personal history of colonic polyps: Secondary | ICD-10-CM

## 2016-05-02 MED ORDER — NA SULFATE-K SULFATE-MG SULF 17.5-3.13-1.6 GM/177ML PO SOLN: 1.0000 | Freq: Once | ORAL | 0 refills | Status: AC

## 2016-05-02 NOTE — Progress Notes (Signed)
No egg or soy allergy known to patient   issues with past sedation with any surgeries  or procedures of hard to wake post op , no intubation problems  No diet pills per patient No home 02 use per patient  No blood thinners per patient  Pt states  issues with constipation - uses miralax as needed and colace as needed No A fib or A flutter

## 2016-05-16 ENCOUNTER — Encounter: Payer: Self-pay | Admitting: Internal Medicine

## 2016-05-16 ENCOUNTER — Ambulatory Visit (AMBULATORY_SURGERY_CENTER): Payer: Medicare Other | Admitting: Internal Medicine

## 2016-05-16 VITALS — BP 133/66 | HR 72 | Temp 97.1°F | Resp 15 | Ht 63.0 in | Wt 230.0 lb

## 2016-05-16 DIAGNOSIS — Z86010 Personal history of colonic polyps: Secondary | ICD-10-CM

## 2016-05-16 MED ORDER — SODIUM CHLORIDE 0.9 % IV SOLN: 500.0000 mL | INTRAVENOUS | Status: DC

## 2016-05-16 NOTE — Patient Instructions (Signed)
YOU HAD AN ENDOSCOPIC PROCEDURE TODAY AT THE [facility]:   Refer to the procedure report that was given to you for any specific questions about what was found during the examination.  If the procedure report does not answer your questions, please call your gastroenterologist to clarify.  If you requested that your care partner not be given the details of your procedure findings, then the procedure report has been included in a sealed envelope for you to review at your convenience later.  YOU SHOULD EXPECT: Some feelings of bloating in the abdomen. Passage of more gas than usual.  Walking can help get rid of the air that was put into your GI tract during the procedure and reduce the bloating. If you had a lower endoscopy (such as a colonoscopy or flexible sigmoidoscopy) you may notice spotting of blood in your stool or on the toilet paper. If you underwent a bowel prep for your procedure, you may not have a normal bowel movement for a few days.  Please Note:  You might notice some irritation and congestion in your nose or some drainage.  This is from the oxygen used during your procedure.  There is no need for concern and it should clear up in a day or so.  SYMPTOMS TO REPORT IMMEDIATELY:   Following lower endoscopy (colonoscopy or flexible sigmoidoscopy):  Excessive amounts of blood in the stool  Significant tenderness or worsening of abdominal pains  Swelling of the abdomen that is new, acute  Fever of 100F or higher    For urgent or emergent issues, a gastroenterologist can be reached at any hour by calling [phone].   DIET:  We do recommend a small meal at first, but then you may proceed to your regular diet.  Drink plenty of fluids but you should avoid alcoholic beverages for 24 hours.  ACTIVITY:  You should plan to take it easy for the rest of today and you should NOT DRIVE or use heavy machinery until tomorrow (because of the sedation medicines used during the test).     FOLLOW UP: Our staff will call the number listed on your records the next business day following your procedure to check on you and address any questions or concerns that you may have regarding the information given to you following your procedure. If we do not reach you, we will leave a message.  However, if you are feeling well and you are not experiencing any problems, there is no need to return our call.  We will assume that you have returned to your regular daily activities without incident.  If any biopsies were taken you will be contacted by phone or by letter within the next 1-3 weeks.  Please call us at [phone] if you have not heard about the biopsies in 3 weeks.    SIGNATURES/CONFIDENTIALITY: You and/or your care partner have signed paperwork which will be entered into your electronic medical record.  These signatures attest to the fact that that the information above on your After Visit Summary has been reviewed and is understood.  Full responsibility of the confidentiality of this discharge information lies with you and/or your care-partner.     Information on hemorrhoids given to you today Resume usual diet and medications   

## 2016-05-16 NOTE — Op Note (Signed)
[place] Patient Name: Amber Sims Procedure Date: 05/16/2016 7:57 AM MRN: [id number] Endoscopist: Docia Chuck. Henrene Pastor , MD Age: 75 Referring MD:  Date of Birth: September 16, 1940 Gender: Female Account #: [id number] Procedure:                Colonoscopy Indications:              Surveillance: Personal history of adenomatous                            polyps on last colonoscopy > 5 years ago. Previous                            examinations with Dr. Olevia Perches 1990, 2005, 2010 Medicines:                Monitored Anesthesia Care Procedure:                Pre-Anesthesia Assessment:                           - Prior to the procedure, a History and Physical                            was performed, and patient medications and                            allergies were reviewed. The patient's tolerance of                            previous anesthesia was also reviewed. The risks                            and benefits of the procedure and the sedation                            options and risks were discussed with the patient.                            All questions were answered, and informed consent                            was obtained. Prior Anticoagulants: The patient has                            taken no previous anticoagulant or antiplatelet                            agents. ASA Grade Assessment: II - A patient with                            mild systemic disease. After reviewing the risks                            and benefits, the patient was deemed in  satisfactory condition to undergo the procedure.                           After obtaining informed consent, the colonoscope                            was passed under direct vision. Throughout the                            procedure, the patient's blood pressure, pulse, and                            oxygen saturations were monitored continuously. The                            Model CF-HQ190L  [id number]) scope was introduced                            through the anus and advanced to the the cecum,                            identified by appendiceal orifice and ileocecal                            valve. The ileocecal valve, appendiceal orifice,                            and rectum were photographed. The quality of the                            bowel preparation was excellent. The colonoscopy                            was performed without difficulty. The patient                            tolerated the procedure well. The bowel preparation                            used was SUPREP. Scope In: 8:38:11 AM Scope Out: 8:47:28 AM Scope Withdrawal Time: 0 hours 7 minutes 17 seconds  Total Procedure Duration: 0 hours 9 minutes 17 seconds  Findings:                 Internal hemorrhoids were found during retroflexion.                           The entire examined colon appeared normal on direct                            and retroflexion views. Complications:            No immediate complications. Estimated blood loss:  None. Estimated Blood Loss:     Estimated blood loss: none. Impression:               - Internal hemorrhoids.                           - The entire examined colon is normal on direct and                            retroflexion views.                           - No specimens collected. Recommendation:           - Repeat colonoscopy is not recommended for                            surveillance.                           - Patient has a contact number available for                            emergencies. The signs and symptoms of potential                            delayed complications were discussed with the                            patient. Return to normal activities tomorrow.                            Written discharge instructions were provided to the                            patient.                           - Resume  previous diet.                           - Continue present medications. Docia Chuck. Henrene Pastor, MD 05/16/2016 8:56:45 AM This report has been signed electronically.

## 2016-05-16 NOTE — Progress Notes (Signed)
Report to PACU, RN, vss, BBS= Clear.  

## 2016-05-16 NOTE — Progress Notes (Signed)
Fracture of right great toe 2 weeks ago. Patient has a boot on.

## 2016-05-17 ENCOUNTER — Telehealth: Payer: Self-pay | Admitting: *Deleted

## 2016-05-17 NOTE — Telephone Encounter (Signed)
  Follow up Call-  Call back number 05/16/2016  Post procedure Call Back phone  # [phone]  Permission to leave phone message Yes  Some recent data might be hidden     Patient questions:  Do you have a fever, pain , or abdominal swelling? No. Pain Score  0 *  Have you tolerated food without any problems? Yes.    Have you been able to return to your normal activities? Yes.    Do you have any questions about your discharge instructions: Diet   No. Medications  No. Follow up visit  No.  Do you have questions or concerns about your Care? No.  Actions: * If pain score is 4 or above: Physician/ provider Notified : no pain.  Pt. States she appreciates the care she Received yesterday.

## 2016-05-29 ENCOUNTER — Ambulatory Visit: Payer: Medicare Other | Admitting: Podiatry

## 2016-06-05 ENCOUNTER — Ambulatory Visit (INDEPENDENT_AMBULATORY_CARE_PROVIDER_SITE_OTHER): Payer: Medicare Other | Admitting: Podiatry

## 2016-06-05 ENCOUNTER — Ambulatory Visit (INDEPENDENT_AMBULATORY_CARE_PROVIDER_SITE_OTHER): Payer: Medicare Other

## 2016-06-05 ENCOUNTER — Encounter: Payer: Self-pay | Admitting: Podiatry

## 2016-06-05 VITALS — BP 137/68 | HR 74 | Resp 18

## 2016-06-05 DIAGNOSIS — R52 Pain, unspecified: Secondary | ICD-10-CM | POA: Diagnosis not present

## 2016-06-05 DIAGNOSIS — Z8781 Personal history of (healed) traumatic fracture: Secondary | ICD-10-CM | POA: Diagnosis not present

## 2016-06-05 DIAGNOSIS — B351 Tinea unguium: Secondary | ICD-10-CM

## 2016-06-05 NOTE — Progress Notes (Signed)
   Subjective:    Patient ID: Amber Sims, female    DOB: 15-Sep-1940, 75 y.o.   MRN: [id number]  HPI   This patient presents from the follow-up visit of 04/30/2016 stating that her right foot has less pain and she notices a slight bump on the side of her right great toe. Patient has a history of a small displaced fracture of the medial base the proximal phalanx the right hallux from an accidental injury occurring at her home on 04/26/2016. She has been wearing a surgical shoe and using an Ace wrap for compression since the visit of 04/30/2016. The patient's husband is present in the treatment room today    Review of Systems  All other systems reviewed and are negative.      Objective:   Physical Exam   BP 137/68 Pulse 74 Respiration 18  Orientated 3  Vascular: Low-grade edema nonpitting about the first MPJ right DP and PT pulses 2/4 bilaterally Capillary reflex immediate bilaterally  Neurological: Sensation to 10 g monofilament wire intact 5/5 bilaterally Vibratory sensation reactive bilaterally Ankle reflex equal reactive bilaterally  Dermatological: No open skin lesions bilaterally  Musculoskeletal: Manual motor testing: Dorsi flexion, plantar flexion, inversion, eversion 5/5 bilaterally Mild palpable tenderness medial first MPJ right foot No pain or crepitus on range of motion right first MPJ   X-ray examination dated 06/05/2016 Patient had previous displaced fracture medial base of the proximal phalanx the right hallux and an x-ray of 04/29/2016  Intact bony structures without fracture and/or dislocation with particular attention to the proximal phalanx the right hallux  Radiographic impression x-ray of 06/05/2016 No acute bony abnormality noted Healed fracture proximal phalanx right hallux       Assessment & Plan:   Assessment: Healed fracture proximal phalanx right hallux  Plan: I informed patient that the fracture has healed in the right  great toe joint DC surgical shoe on right foot and only wear as needed if the pain increases with standing walking Increase activity to tolerance Wear compression hose and right lower extremity  Patient is discharged

## 2016-06-05 NOTE — Patient Instructions (Signed)
Today the fracture in the right great toe on x-ray appears healed Wear the surgical shoe as needed and increase her activity to tolerance Where your compression hose on a daily basis

## 2016-06-20 ENCOUNTER — Ambulatory Visit (INDEPENDENT_AMBULATORY_CARE_PROVIDER_SITE_OTHER): Payer: Medicare Other

## 2016-06-20 ENCOUNTER — Ambulatory Visit (INDEPENDENT_AMBULATORY_CARE_PROVIDER_SITE_OTHER): Payer: Medicare Other | Admitting: Podiatry

## 2016-06-20 DIAGNOSIS — S99921A Unspecified injury of right foot, initial encounter: Secondary | ICD-10-CM

## 2016-06-20 DIAGNOSIS — M7751 Other enthesopathy of right foot: Secondary | ICD-10-CM | POA: Diagnosis not present

## 2016-06-20 DIAGNOSIS — M79674 Pain in right toe(s): Secondary | ICD-10-CM

## 2016-06-20 MED ORDER — IBUPROFEN-FAMOTIDINE 800-26.6 MG PO TABS: 1.0000 | ORAL_TABLET | Freq: Three times a day (TID) | ORAL | 1 refills | Status: DC

## 2016-06-22 MED ORDER — BETAMETHASONE SOD PHOS & ACET 6 (3-3) MG/ML IJ SUSP: 12.0000 mg | Freq: Once | INTRAMUSCULAR | Status: DC

## 2016-06-22 NOTE — Progress Notes (Signed)
Subjective:  Patient presents today for evaluation of a new complaint of injury the first MPJ right foot. Patient states she was diagnosed with a fracture of the right great toe, however it is healed significantly until recently when she sustained an injury. Patient presents today for further treatment and evaluation    Objective/Physical Exam General: The patient is alert and oriented x3 in no acute distress.  Dermatology: Skin is warm, dry and supple bilateral lower extremities. Negative for open lesions or macerations.  Vascular: Palpable pedal pulses bilaterally. No edema or erythema noted. Capillary refill within normal limits.  Neurological: Epicritic and protective threshold grossly intact bilaterally.   Musculoskeletal Exam: Significant pain on palpation noted to the right great toe. Range of motion within normal limits to all pedal and ankle joints bilateral. Muscle strength 5/5 in all groups bilateral.   Radiographic Exam:  Possible subtle finding healed fracture noted to the right great toe proximal Normal osseous mineralization. Joint spaces preserved. No fracture/dislocation/boney destruction.    Assessment: #1 capsulitis first MPJ right foot #2 soft tissue injury right foot #3 pain in right foot   Plan of Care:  #1 Patient was evaluated. #2 injection of 0.5 mL Celestone Soluspan injected in the first MPJ of the right foot.  #3 recommend wearing postop shoe that the patient already has #4 recommend conservative management of rest ice compression and elevation. #5 prescription for glasses #6 patient is to return to clinic after a trip to [place] to visit    Dr. Edrick Kins, [place]

## 2016-06-24 ENCOUNTER — Telehealth: Payer: Self-pay | Admitting: *Deleted

## 2016-06-24 NOTE — Telephone Encounter (Signed)
Rx for Meloxicam15mg  #60 with 1 refill will do.  Rx was for duexis, supposed to be just a $10 copay for all insurances other than Medicaid/Medicare. If it isn't covered, then there is a standing order for generic Ibuprofen800mg .  I'll have to follow up with the pharmacy.

## 2016-06-24 NOTE — Telephone Encounter (Addendum)
Pt states the medication Dr. Amalia Hailey ordered 06/20/2016 cost $2000.00 and she would like an alternative. 06/25/2016-Left message informing pt of Dr. Amalia Hailey orders, and to call with concerns.

## 2016-06-25 ENCOUNTER — Telehealth: Payer: Self-pay | Admitting: *Deleted

## 2016-06-25 MED ORDER — MELOXICAM 15 MG PO TABS: 15.0000 mg | ORAL_TABLET | Freq: Every day | ORAL | 1 refills | Status: DC

## 2016-06-25 NOTE — Telephone Encounter (Signed)
Pt request the name of the pharmacy the new rx was sent. Informed pt the rx was called to [facility]. Pt asked how long she should take the medication. I told her Dr. Amalia Hailey had written for 60 days +1refill, to take the entire 60 days as directed unless she had problems, because often inflammation is difficult to get rid of if it has been going on for a long time. Pt states understanding.

## 2016-07-17 ENCOUNTER — Ambulatory Visit: Payer: Medicare Other | Admitting: Podiatry

## 2016-09-16 DIAGNOSIS — Z961 Presence of intraocular lens: Secondary | ICD-10-CM | POA: Diagnosis not present

## 2016-09-16 DIAGNOSIS — H40053 Ocular hypertension, bilateral: Secondary | ICD-10-CM | POA: Diagnosis not present

## 2016-09-16 DIAGNOSIS — H26493 Other secondary cataract, bilateral: Secondary | ICD-10-CM | POA: Diagnosis not present

## 2016-09-16 DIAGNOSIS — H43813 Vitreous degeneration, bilateral: Secondary | ICD-10-CM | POA: Diagnosis not present

## 2016-10-03 DIAGNOSIS — Z779 Other contact with and (suspected) exposures hazardous to health: Secondary | ICD-10-CM | POA: Diagnosis not present

## 2016-10-03 DIAGNOSIS — H04122 Dry eye syndrome of left lacrimal gland: Secondary | ICD-10-CM | POA: Diagnosis not present

## 2016-10-03 DIAGNOSIS — Z6841 Body Mass Index (BMI) 40.0 and over, adult: Secondary | ICD-10-CM | POA: Diagnosis not present

## 2016-10-03 DIAGNOSIS — Z124 Encounter for screening for malignant neoplasm of cervix: Secondary | ICD-10-CM | POA: Diagnosis not present

## 2016-10-07 DIAGNOSIS — D2262 Melanocytic nevi of left upper limb, including shoulder: Secondary | ICD-10-CM | POA: Diagnosis not present

## 2016-10-07 DIAGNOSIS — D2261 Melanocytic nevi of right upper limb, including shoulder: Secondary | ICD-10-CM | POA: Diagnosis not present

## 2016-10-07 DIAGNOSIS — D2272 Melanocytic nevi of left lower limb, including hip: Secondary | ICD-10-CM | POA: Diagnosis not present

## 2016-10-07 DIAGNOSIS — D1801 Hemangioma of skin and subcutaneous tissue: Secondary | ICD-10-CM | POA: Diagnosis not present

## 2016-10-07 DIAGNOSIS — L821 Other seborrheic keratosis: Secondary | ICD-10-CM | POA: Diagnosis not present

## 2016-10-07 DIAGNOSIS — D2271 Melanocytic nevi of right lower limb, including hip: Secondary | ICD-10-CM | POA: Diagnosis not present

## 2016-10-07 DIAGNOSIS — L918 Other hypertrophic disorders of the skin: Secondary | ICD-10-CM | POA: Diagnosis not present

## 2016-10-07 DIAGNOSIS — L814 Other melanin hyperpigmentation: Secondary | ICD-10-CM | POA: Diagnosis not present

## 2016-10-08 DIAGNOSIS — H04122 Dry eye syndrome of left lacrimal gland: Secondary | ICD-10-CM | POA: Diagnosis not present

## 2016-12-04 DIAGNOSIS — L570 Actinic keratosis: Secondary | ICD-10-CM | POA: Diagnosis not present

## 2016-12-04 DIAGNOSIS — L309 Dermatitis, unspecified: Secondary | ICD-10-CM | POA: Diagnosis not present

## 2016-12-16 DIAGNOSIS — K219 Gastro-esophageal reflux disease without esophagitis: Secondary | ICD-10-CM | POA: Diagnosis not present

## 2016-12-25 DIAGNOSIS — H6123 Impacted cerumen, bilateral: Secondary | ICD-10-CM | POA: Diagnosis not present

## 2016-12-25 DIAGNOSIS — J301 Allergic rhinitis due to pollen: Secondary | ICD-10-CM | POA: Diagnosis not present

## 2016-12-30 DIAGNOSIS — H60541 Acute eczematoid otitis externa, right ear: Secondary | ICD-10-CM | POA: Diagnosis not present

## 2016-12-30 DIAGNOSIS — H6123 Impacted cerumen, bilateral: Secondary | ICD-10-CM | POA: Diagnosis not present

## 2017-01-02 DIAGNOSIS — H6063 Unspecified chronic otitis externa, bilateral: Secondary | ICD-10-CM | POA: Diagnosis not present

## 2017-01-02 DIAGNOSIS — H903 Sensorineural hearing loss, bilateral: Secondary | ICD-10-CM | POA: Diagnosis not present

## 2017-01-02 DIAGNOSIS — H6123 Impacted cerumen, bilateral: Secondary | ICD-10-CM | POA: Diagnosis not present

## 2017-01-15 ENCOUNTER — Encounter: Payer: Self-pay | Admitting: Gynecology

## 2017-01-29 DIAGNOSIS — H903 Sensorineural hearing loss, bilateral: Secondary | ICD-10-CM | POA: Diagnosis not present

## 2017-02-17 DIAGNOSIS — I10 Essential (primary) hypertension: Secondary | ICD-10-CM | POA: Diagnosis not present

## 2017-02-17 DIAGNOSIS — Z0000 Encounter for general adult medical examination without abnormal findings: Secondary | ICD-10-CM | POA: Diagnosis not present

## 2017-02-17 DIAGNOSIS — Z1389 Encounter for screening for other disorder: Secondary | ICD-10-CM | POA: Diagnosis not present

## 2017-02-17 DIAGNOSIS — Z6841 Body Mass Index (BMI) 40.0 and over, adult: Secondary | ICD-10-CM | POA: Diagnosis not present

## 2017-02-17 DIAGNOSIS — K219 Gastro-esophageal reflux disease without esophagitis: Secondary | ICD-10-CM | POA: Diagnosis not present

## 2017-02-17 DIAGNOSIS — I872 Venous insufficiency (chronic) (peripheral): Secondary | ICD-10-CM | POA: Diagnosis not present

## 2017-03-18 DIAGNOSIS — N390 Urinary tract infection, site not specified: Secondary | ICD-10-CM | POA: Diagnosis not present

## 2017-03-20 DIAGNOSIS — H40053 Ocular hypertension, bilateral: Secondary | ICD-10-CM | POA: Diagnosis not present

## 2017-03-20 DIAGNOSIS — Z961 Presence of intraocular lens: Secondary | ICD-10-CM | POA: Diagnosis not present

## 2017-03-20 DIAGNOSIS — H26493 Other secondary cataract, bilateral: Secondary | ICD-10-CM | POA: Diagnosis not present

## 2017-04-04 DIAGNOSIS — G4485 Primary stabbing headache: Secondary | ICD-10-CM | POA: Diagnosis not present

## 2017-04-04 DIAGNOSIS — R51 Headache: Secondary | ICD-10-CM | POA: Diagnosis not present

## 2017-05-19 DIAGNOSIS — H40053 Ocular hypertension, bilateral: Secondary | ICD-10-CM | POA: Diagnosis not present

## 2017-05-19 DIAGNOSIS — R51 Headache: Secondary | ICD-10-CM | POA: Diagnosis not present

## 2017-05-19 DIAGNOSIS — H04123 Dry eye syndrome of bilateral lacrimal glands: Secondary | ICD-10-CM | POA: Diagnosis not present

## 2017-05-19 DIAGNOSIS — S0501XA Injury of conjunctiva and corneal abrasion without foreign body, right eye, initial encounter: Secondary | ICD-10-CM | POA: Diagnosis not present

## 2017-05-29 DIAGNOSIS — N3010 Interstitial cystitis (chronic) without hematuria: Secondary | ICD-10-CM | POA: Diagnosis not present

## 2017-06-03 DIAGNOSIS — G4485 Primary stabbing headache: Secondary | ICD-10-CM | POA: Diagnosis not present

## 2017-06-05 DIAGNOSIS — M1712 Unilateral primary osteoarthritis, left knee: Secondary | ICD-10-CM | POA: Diagnosis not present

## 2017-06-27 ENCOUNTER — Other Ambulatory Visit: Payer: Self-pay | Admitting: Internal Medicine

## 2017-06-27 DIAGNOSIS — G4485 Primary stabbing headache: Secondary | ICD-10-CM

## 2017-06-30 ENCOUNTER — Ambulatory Visit
Admission: RE | Admit: 2017-06-30 | Discharge: 2017-06-30 | Disposition: A | Discharge status: Home / Self Care | Payer: PPO | Source: Ambulatory Visit | Attending: Internal Medicine | Admitting: Internal Medicine

## 2017-06-30 DIAGNOSIS — D320 Benign neoplasm of cerebral meninges: Secondary | ICD-10-CM | POA: Diagnosis not present

## 2017-06-30 DIAGNOSIS — G4485 Primary stabbing headache: Secondary | ICD-10-CM

## 2017-07-02 ENCOUNTER — Other Ambulatory Visit (HOSPITAL_COMMUNITY): Payer: Self-pay | Admitting: Internal Medicine

## 2017-07-02 DIAGNOSIS — F419 Anxiety disorder, unspecified: Secondary | ICD-10-CM | POA: Diagnosis not present

## 2017-07-02 DIAGNOSIS — D329 Benign neoplasm of meninges, unspecified: Secondary | ICD-10-CM | POA: Diagnosis not present

## 2017-07-02 DIAGNOSIS — R51 Headache: Secondary | ICD-10-CM | POA: Diagnosis not present

## 2017-07-02 DIAGNOSIS — M2550 Pain in unspecified joint: Secondary | ICD-10-CM | POA: Diagnosis not present

## 2017-07-03 DIAGNOSIS — R51 Headache: Secondary | ICD-10-CM | POA: Diagnosis not present

## 2017-07-14 ENCOUNTER — Ambulatory Visit (HOSPITAL_COMMUNITY)
Admission: RE | Admit: 2017-07-14 | Discharge: 2017-07-14 | Disposition: A | Discharge status: Home / Self Care | Payer: PPO | Source: Ambulatory Visit | Attending: Internal Medicine | Admitting: Internal Medicine

## 2017-07-14 DIAGNOSIS — D320 Benign neoplasm of cerebral meninges: Secondary | ICD-10-CM | POA: Diagnosis not present

## 2017-07-14 DIAGNOSIS — D329 Benign neoplasm of meninges, unspecified: Secondary | ICD-10-CM

## 2017-07-14 LAB — CREATININE, SERUM
Creatinine, Ser: 1.24 mg/dL — ABNORMAL HIGH (ref 0.44–1.00)
GFR, EST AFRICAN AMERICAN: 48 mL/min — AB (ref >60)
GFR, EST NON AFRICAN AMERICAN: 41 mL/min — AB (ref >60)

## 2017-07-14 MED ORDER — GADOBENATE DIMEGLUMINE 529 MG/ML IV SOLN
20.0000 mL | Freq: Once | INTRAVENOUS | Status: AC | PRN
  Administered 2017-07-14: 20 mL via INTRAVENOUS

## 2017-07-15 DIAGNOSIS — D329 Benign neoplasm of meninges, unspecified: Secondary | ICD-10-CM | POA: Diagnosis not present

## 2017-07-18 ENCOUNTER — Other Ambulatory Visit: Payer: Self-pay | Admitting: General Surgery

## 2017-07-18 DIAGNOSIS — Z6841 Body Mass Index (BMI) 40.0 and over, adult: Secondary | ICD-10-CM | POA: Diagnosis not present

## 2017-07-18 DIAGNOSIS — M316 Other giant cell arteritis: Secondary | ICD-10-CM | POA: Diagnosis not present

## 2017-07-18 DIAGNOSIS — D329 Benign neoplasm of meninges, unspecified: Secondary | ICD-10-CM | POA: Diagnosis not present

## 2017-07-18 DIAGNOSIS — I10 Essential (primary) hypertension: Secondary | ICD-10-CM | POA: Diagnosis not present

## 2017-07-18 DIAGNOSIS — K219 Gastro-esophageal reflux disease without esophagitis: Secondary | ICD-10-CM | POA: Diagnosis not present

## 2017-07-28 DIAGNOSIS — M2550 Pain in unspecified joint: Secondary | ICD-10-CM | POA: Diagnosis not present

## 2017-07-28 DIAGNOSIS — G4489 Other headache syndrome: Secondary | ICD-10-CM | POA: Diagnosis not present

## 2017-07-28 DIAGNOSIS — Z6841 Body Mass Index (BMI) 40.0 and over, adult: Secondary | ICD-10-CM | POA: Diagnosis not present

## 2017-07-29 ENCOUNTER — Other Ambulatory Visit: Payer: Self-pay | Admitting: General Surgery

## 2017-07-29 DIAGNOSIS — I7090 Unspecified atherosclerosis: Secondary | ICD-10-CM | POA: Diagnosis not present

## 2017-07-29 DIAGNOSIS — M316 Other giant cell arteritis: Secondary | ICD-10-CM | POA: Diagnosis not present

## 2017-07-29 DIAGNOSIS — I672 Cerebral atherosclerosis: Secondary | ICD-10-CM | POA: Diagnosis not present

## 2017-08-08 DIAGNOSIS — R51 Headache: Secondary | ICD-10-CM | POA: Diagnosis not present

## 2017-08-08 DIAGNOSIS — M2550 Pain in unspecified joint: Secondary | ICD-10-CM | POA: Diagnosis not present

## 2017-08-08 DIAGNOSIS — D329 Benign neoplasm of meninges, unspecified: Secondary | ICD-10-CM | POA: Diagnosis not present

## 2017-08-20 DIAGNOSIS — D329 Benign neoplasm of meninges, unspecified: Secondary | ICD-10-CM | POA: Diagnosis not present

## 2017-08-20 DIAGNOSIS — R51 Headache: Secondary | ICD-10-CM | POA: Diagnosis not present

## 2017-08-20 DIAGNOSIS — I10 Essential (primary) hypertension: Secondary | ICD-10-CM | POA: Diagnosis not present

## 2017-08-20 DIAGNOSIS — M8580 Other specified disorders of bone density and structure, unspecified site: Secondary | ICD-10-CM | POA: Diagnosis not present

## 2017-08-21 DIAGNOSIS — M1712 Unilateral primary osteoarthritis, left knee: Secondary | ICD-10-CM | POA: Diagnosis not present

## 2017-08-21 DIAGNOSIS — M1711 Unilateral primary osteoarthritis, right knee: Secondary | ICD-10-CM | POA: Diagnosis not present

## 2017-08-21 DIAGNOSIS — M25561 Pain in right knee: Secondary | ICD-10-CM | POA: Diagnosis not present

## 2017-08-31 ENCOUNTER — Other Ambulatory Visit: Payer: Self-pay

## 2017-08-31 ENCOUNTER — Encounter (HOSPITAL_COMMUNITY): Payer: Self-pay | Admitting: Oncology

## 2017-08-31 ENCOUNTER — Emergency Department (HOSPITAL_COMMUNITY): Payer: PPO

## 2017-08-31 ENCOUNTER — Emergency Department (HOSPITAL_COMMUNITY)
Admission: EM | Admit: 2017-08-31 | Discharge: 2017-08-31 | Disposition: A | Discharge status: Home / Self Care | Payer: PPO | Attending: Emergency Medicine | Admitting: Emergency Medicine

## 2017-08-31 DIAGNOSIS — I499 Cardiac arrhythmia, unspecified: Secondary | ICD-10-CM | POA: Diagnosis not present

## 2017-08-31 DIAGNOSIS — I4891 Unspecified atrial fibrillation: Secondary | ICD-10-CM | POA: Diagnosis not present

## 2017-08-31 DIAGNOSIS — I4901 Ventricular fibrillation: Secondary | ICD-10-CM | POA: Diagnosis not present

## 2017-08-31 DIAGNOSIS — Z79899 Other long term (current) drug therapy: Secondary | ICD-10-CM | POA: Insufficient documentation

## 2017-08-31 DIAGNOSIS — I10 Essential (primary) hypertension: Secondary | ICD-10-CM | POA: Diagnosis not present

## 2017-08-31 DIAGNOSIS — R002 Palpitations: Secondary | ICD-10-CM | POA: Diagnosis present

## 2017-08-31 DIAGNOSIS — R000 Tachycardia, unspecified: Secondary | ICD-10-CM | POA: Diagnosis not present

## 2017-08-31 LAB — BASIC METABOLIC PANEL
ANION GAP: 10 (ref 5–15)
BUN: 26 mg/dL — ABNORMAL HIGH (ref 6–20)
CHLORIDE: 106 mmol/L (ref 101–111)
CO2: 24 mmol/L (ref 22–32)
Calcium: 10 mg/dL (ref 8.9–10.3)
Creatinine, Ser: 1.02 mg/dL — ABNORMAL HIGH (ref 0.44–1.00)
GFR calc Af Amer: >60 mL/min (ref >60)
GFR, EST NON AFRICAN AMERICAN: 52 mL/min — AB (ref >60)
GLUCOSE: 120 mg/dL — AB (ref 65–99)
POTASSIUM: 3.1 mmol/L — AB (ref 3.5–5.1)
SODIUM: 140 mmol/L (ref 135–145)

## 2017-08-31 LAB — CBC WITH DIFFERENTIAL/PLATELET
BASOS ABS: 0 10*3/uL (ref 0.0–0.1)
Basophils Relative: 0 %
EOS PCT: 1 %
Eosinophils Absolute: 0.1 10*3/uL (ref 0.0–0.7)
HCT: 41.7 % (ref 36.0–46.0)
HEMOGLOBIN: 14 g/dL (ref 12.0–15.0)
LYMPHS ABS: 3 10*3/uL (ref 0.7–4.0)
LYMPHS PCT: 34 %
MCH: 30.4 pg (ref 26.0–34.0)
MCHC: 33.6 g/dL (ref 30.0–36.0)
MCV: 90.5 fL (ref 78.0–100.0)
Monocytes Absolute: 0.7 10*3/uL (ref 0.1–1.0)
Monocytes Relative: 8 %
NEUTROS PCT: 57 %
Neutro Abs: 5 10*3/uL (ref 1.7–7.7)
PLATELETS: 205 10*3/uL (ref 150–400)
RBC: 4.61 MIL/uL (ref 3.87–5.11)
RDW: 14.5 % (ref 11.5–15.5)
WBC: 8.8 10*3/uL (ref 4.0–10.5)

## 2017-08-31 LAB — URINALYSIS, ROUTINE W REFLEX MICROSCOPIC
BACTERIA UA: NONE SEEN
BILIRUBIN URINE: NEGATIVE
Glucose, UA: NEGATIVE mg/dL
KETONES UR: NEGATIVE mg/dL
LEUKOCYTES UA: NEGATIVE
NITRITE: NEGATIVE
PROTEIN: NEGATIVE mg/dL
Specific Gravity, Urine: 1.021 (ref 1.005–1.030)
pH: 5 (ref 5.0–8.0)

## 2017-08-31 LAB — MAGNESIUM: MAGNESIUM: 2 mg/dL (ref 1.7–2.4)

## 2017-08-31 LAB — TSH: TSH: 1.985 u[IU]/mL (ref 0.350–4.500)

## 2017-08-31 MED ORDER — RIVAROXABAN 20 MG PO TABS: 20.0000 mg | ORAL_TABLET | Freq: Every day | ORAL | 1 refills | Status: DC

## 2017-08-31 MED ORDER — RIVAROXABAN 20 MG PO TABS
20.0000 mg | ORAL_TABLET | Freq: Once | ORAL | Status: AC
  Administered 2017-08-31: 20 mg via ORAL
  Filled 2017-08-31: qty 1

## 2017-08-31 MED ORDER — POTASSIUM CHLORIDE CRYS ER 20 MEQ PO TBCR
40.0000 meq | EXTENDED_RELEASE_TABLET | Freq: Once | ORAL | Status: AC
  Administered 2017-08-31: 40 meq via ORAL
  Filled 2017-08-31: qty 2

## 2017-08-31 MED ORDER — RIVAROXABAN (XARELTO) EDUCATION KIT FOR AFIB PATIENTS
PACK | Freq: Once | Status: AC
  Administered 2017-08-31: 07:00:00
  Filled 2017-08-31: qty 1

## 2017-08-31 NOTE — ED Provider Notes (Signed)
TIME SEEN: 4:14 AM  CHIEF COMPLAINT: Palpitations  HPI: Patient is a 76 year old female with history of hypertension, hyperlipidemia who presents to the emergency department with palpitations that started around 2 AM while at rest.  Resolved on the way to the emergency department with EMS.  Patient was noted to be in atrial fibrillation with a rate in the 130s.  Denies a history of the same.  Not on antiplatelets or anticoagulants.  Denies chest discomfort, shortness of breath, nausea, vomiting, diaphoresis or dizziness.  No recent fever, cough, vomiting or diarrhea.  Patient on triamterene HCTZ for blood pressure.  ROS: See HPI Constitutional: no fever  Eyes: no drainage  ENT: no runny nose   Cardiovascular:  no chest pain  Resp: no SOB  GI: no vomiting GU: no dysuria Integumentary: no rash  Allergy: no hives  Musculoskeletal: no leg swelling  Neurological: no slurred speech ROS otherwise negative  PAST MEDICAL HISTORY/PAST SURGICAL HISTORY:  Past Medical History:  Diagnosis Date  . Allergy    occasionally   . Anxiety    closterphobia  . Arthritis    knees and thumbs   . Cataract    bilaterally and removed  . Colon polyps   . Eye pressure    elevated eye pressure because of thickened cornea but monitoring for glaucona  . GERD (gastroesophageal reflux disease)   . Hyperlipidemia   . Hypertension   . IBS (irritable bowel syndrome)   . Osteopenia   . Other fracture of right great toe, subsequent encounter for fracture with routine healing   . Sciatica     MEDICATIONS:  Prior to Admission medications   Medication Sig Start Date End Date Taking? Authorizing Provider  cholecalciferol (VITAMIN D) 1000 UNITS tablet Take 1,000 Units by mouth daily.    [provider]  docusate sodium (COLACE) 100 MG capsule Take 100 mg by mouth 2 (two) times daily.    [provider]  esomeprazole (NEXIUM) 40 MG capsule Take 40 mg by mouth daily at 12 noon.    [provider]  HYDROcodone-acetaminophen (NORCO/VICODIN) 5-325 MG tablet  04/29/16   [provider]  ibuprofen (ADVIL,MOTRIN) 200 MG tablet Take 200 mg by mouth every 6 (six) hours as needed.    [provider]  Ibuprofen-Famotidine (DUEXIS) 800-26.6 MG TABS Take 1 tablet by mouth 3 (three) times daily. 06/20/16   Edrick Kins, DPM  meloxicam (MOBIC) 15 MG tablet Take 1 tablet (15 mg total) by mouth daily. 06/25/16   Edrick Kins, DPM  Polyethylene Glycol 3350 (MIRALAX PO) Take by mouth.    [provider]  psyllium (METAMUCIL) 58.6 % powder Take 1 packet by mouth 3 (three) times daily.    [provider]  Pyridoxine HCl (VITAMIN B6 PO) Take 100 mg by mouth.    [provider]  ranitidine (ZANTAC) 150 MG tablet Take 150 mg by mouth 2 (two) times daily.    [provider]  triamterene-hydrochlorothiazide Bradd Burner) 37.5-25 MG tablet  05/13/16   [provider]    ALLERGIES:  Allergies  Allergen Reactions  . Fosamax [Alendronate Sodium]   . Statins   . Timolol   . Welchol [Colesevelam Hcl]   . Zetia [Ezetimibe]     SOCIAL HISTORY:  Social History   Tobacco Use  . Smoking status: Never Smoker  . Smokeless tobacco: Never Used  Substance Use Topics  . Alcohol use: No    FAMILY HISTORY: Family History  Problem Relation  Age of Onset  . Dementia Mother   . Cancer - Lung Brother   . Dementia Brother   . Colon polyps Brother   . Colon cancer Neg Hx   . Rectal cancer Neg Hx   . Stomach cancer Neg Hx     EXAM: BP 125/72 (BP Location: Right Arm)   Pulse 77   Temp 98 F (36.7 C) (Oral)   Resp 15   SpO2 100%  CONSTITUTIONAL: Alert and oriented and responds appropriately to questions. Well-appearing; well-nourished HEAD: Normocephalic EYES: Conjunctivae clear, pupils appear equal, EOMI ENT: normal nose; moist mucous membranes NECK: Supple, no meningismus, no nuchal rigidity, no LAD  CARD: RRR; S1 and S2  appreciated; no murmurs, no clicks, no rubs, no gallops RESP: Normal chest excursion without splinting or tachypnea; breath sounds clear and equal bilaterally; no wheezes, no rhonchi, no rales, no hypoxia or respiratory distress, speaking full sentences ABD/GI: Normal bowel sounds; non-distended; soft, non-tender, no rebound, no guarding, no peritoneal signs, no hepatosplenomegaly BACK:  The back appears normal and is non-tender to palpation, there is no CVA tenderness EXT: Normal ROM in all joints; non-tender to palpation; no edema; normal capillary refill; no cyanosis, no calf tenderness or swelling    SKIN: Normal color for age and race; warm; no rash NEURO: Moves all extremities equally PSYCH: The patient's mood and manner are appropriate. Grooming and personal hygiene are appropriate.  MEDICAL DECISION MAKING: Patient here with episode of atrial fibrillation with RVR.  Confirmed with EKG with EMS.  Please see below.  No history of the same.  Currently in a sinus rhythm and asymptomatic.  We will check electrolytes, blood counts, TSH today.  We will also check urine for sign of infection or dehydration.  Will obtain cardiac labs and chest x-ray.  EKG shows no ischemic abnormality.  Her chads vasc 2 score is 4.  Anticipate that she will need to be discharged on anticoagulation and possibly medication for rate control.  Her heart rate is currently in the 60s.  ED PROGRESS: Patient's workup is unremarkable other than potassium of 3.1.  Have given oral replacement.  Normal magnesium.  Normal hemoglobin.  Urine shows no sign of infection and no ketones.  TSH normal.  She remains in a sinus rhythm.  Will discuss with cardiology but I feel comfortable with outpatient follow-up and she is comfortable with this as well.  Pharmacy to dose her Xarelto given she has had GFR between 41 and 52 previously.  She will need to have her kidney function followed closely.  If her creatinine clearance dropped below 50 she  would need to have her Xarelto re-dose per pharmacist.  7:30 AM  Cardiology has been paged twice with no response.  Patient does not want to wait any longer.  She will be discharged on Xarelto and can follow-up in the atrial fibrillation clinic to decide if they would like to start her on any rate control medications or antiarrhythmics.  I am hesitant to go ahead and start her on beta-blockers myself given she has had some low blood pressures here in the low 509 systolic and heart rates in the low 60s.  Patient is comfortable with this plan.  Discussed at length return precautions.  She is started on Xarelto and will also follow-up with her primary care physician.   At this time, I do not feel there is any life-threatening condition present. I have reviewed and discussed all results (EKG, imaging, lab, urine as appropriate) and  exam findings with patient/family. I have reviewed nursing notes and appropriate previous records.  I feel the patient is safe to be discharged home without further emergent workup and can continue workup as an outpatient as needed. Discussed usual and customary return precautions. Patient/family verbalize understanding and are comfortable with this plan.  Outpatient follow-up has been provided if needed. All questions have been answered.    EKG Interpretation  Date/Time:  Sunday August 31 2017 04:08:59 EST Ventricular Rate:  74 PR Interval:    QRS Duration: 66 QT Interval:  380 QTC Calculation: 422 R Axis:   72 Text Interpretation:  Sinus rhythm Multiple ventricular premature complexes Confirmed by Pryor Curia [id number]) on 08/31/2017 4:13:24 AM                    [place] Warriner, Delice Bison, DO 08/31/17 0730

## 2017-08-31 NOTE — Progress Notes (Signed)
ANTICOAGULATION CONSULT NOTE - Initial Consult  Pharmacy Consult for Xarelto  Indication: atrial fibrillation  Allergies  Allergen Reactions  . Fosamax [Alendronate Sodium] Other (See Comments)    indegestion   . Statins   . Timolol   . Welchol [Colesevelam Hcl]   . Zetia [Ezetimibe]    Patient Measurements: Height: 5\' 4"  (162.6 cm) Weight: 237 lb 8 oz (107.7 kg) IBW/kg (Calculated) : 54.7  Vital Signs: Temp: 98 F (36.7 C) (12/30 0349) Temp Source: Oral (12/30 0349) BP: 117/55 (12/30 0700) Pulse Rate: 64 (12/30 0700)  Labs: Recent Labs    08/31/17 0433  HGB 14.0  HCT 41.7  PLT 205  CREATININE 1.02*    Estimated Creatinine Clearance: 57.1 mL/min (A) (by C-G formula based on SCr of 1.02 mg/dL (H)).   Medical History: Past Medical History:  Diagnosis Date  . Allergy    occasionally   . Anxiety    closterphobia  . Arthritis    knees and thumbs   . Cataract    bilaterally and removed  . Colon polyps   . Eye pressure    elevated eye pressure because of thickened cornea but monitoring for glaucona  . GERD (gastroesophageal reflux disease)   . Hyperlipidemia   . Hypertension   . IBS (irritable bowel syndrome)   . Osteopenia   . Other fracture of right great toe, subsequent encounter for fracture with routine healing   . Sciatica    Assessment: 76 yo female presenting with palpitations, noted to be in afib. Pharmacy to dose Xarelto prior to discharge. No oral anticoagulation PTA.   Hgb 14, plts 205. No s/s bleeding reported.   SCr 1.02 for estimated CrCl ~ 55-60 mL/min and estimated nCrCl ~ 50-55 mL/min.   Goal of Therapy:  Monitor platelets by anticoagulation protocol: Yes   Plan:  Xarelto 20mg  daily Monitor for s/s bleeding and changes in renal function Education provided  Amber Sims, PharmD Clinical Pharmacist 08/31/17 7:36 AM

## 2017-08-31 NOTE — ED Notes (Signed)
Nurse drawing labs. 

## 2017-08-31 NOTE — ED Notes (Signed)
Re-paged cards  

## 2017-08-31 NOTE — ED Triage Notes (Signed)
Pt bib [facility] from home d/t waking up w/ the sensation of her heart racing.  Per EMS pt reported that her HR is normally in the 60's.  When pt checked HR at home it was 110-120.  12 lead in the field showed a. Fib rvr.  Pt converted to NSR.  Per EMS pt's HR got as high as 160's. Pt denies shob, CP or dizziness.  Pt is A&O x 4.

## 2017-08-31 NOTE — Discharge Instructions (Signed)

## 2017-09-08 ENCOUNTER — Ambulatory Visit (HOSPITAL_COMMUNITY)
Admission: RE | Admit: 2017-09-08 | Discharge: 2017-09-08 | Disposition: A | Discharge status: Home / Self Care | Payer: PPO | Source: Ambulatory Visit | Attending: Nurse Practitioner | Admitting: Nurse Practitioner

## 2017-09-08 ENCOUNTER — Encounter (HOSPITAL_COMMUNITY): Payer: Self-pay | Admitting: Nurse Practitioner

## 2017-09-08 VITALS — BP 122/70 | HR 80 | Ht 64.0 in | Wt 235.0 lb

## 2017-09-08 DIAGNOSIS — I10 Essential (primary) hypertension: Secondary | ICD-10-CM | POA: Insufficient documentation

## 2017-09-08 DIAGNOSIS — K589 Irritable bowel syndrome without diarrhea: Secondary | ICD-10-CM | POA: Insufficient documentation

## 2017-09-08 DIAGNOSIS — F419 Anxiety disorder, unspecified: Secondary | ICD-10-CM | POA: Diagnosis not present

## 2017-09-08 DIAGNOSIS — E785 Hyperlipidemia, unspecified: Secondary | ICD-10-CM | POA: Insufficient documentation

## 2017-09-08 DIAGNOSIS — K219 Gastro-esophageal reflux disease without esophagitis: Secondary | ICD-10-CM | POA: Diagnosis not present

## 2017-09-08 DIAGNOSIS — D320 Benign neoplasm of cerebral meninges: Secondary | ICD-10-CM | POA: Insufficient documentation

## 2017-09-08 DIAGNOSIS — M8580 Other specified disorders of bone density and structure, unspecified site: Secondary | ICD-10-CM | POA: Diagnosis not present

## 2017-09-08 DIAGNOSIS — I480 Paroxysmal atrial fibrillation: Secondary | ICD-10-CM | POA: Diagnosis not present

## 2017-09-08 DIAGNOSIS — Z8371 Family history of colonic polyps: Secondary | ICD-10-CM | POA: Insufficient documentation

## 2017-09-08 DIAGNOSIS — Z5181 Encounter for therapeutic drug level monitoring: Secondary | ICD-10-CM | POA: Diagnosis not present

## 2017-09-08 DIAGNOSIS — M180 Bilateral primary osteoarthritis of first carpometacarpal joints: Secondary | ICD-10-CM | POA: Insufficient documentation

## 2017-09-08 DIAGNOSIS — Z86010 Personal history of colonic polyps: Secondary | ICD-10-CM | POA: Diagnosis not present

## 2017-09-08 DIAGNOSIS — Z79899 Other long term (current) drug therapy: Secondary | ICD-10-CM | POA: Insufficient documentation

## 2017-09-08 DIAGNOSIS — M170 Bilateral primary osteoarthritis of knee: Secondary | ICD-10-CM | POA: Diagnosis not present

## 2017-09-08 DIAGNOSIS — Z7901 Long term (current) use of anticoagulants: Secondary | ICD-10-CM | POA: Insufficient documentation

## 2017-09-08 MED ORDER — DILTIAZEM HCL 30 MG PO TABS: ORAL_TABLET | ORAL | 1 refills | Status: DC

## 2017-09-08 NOTE — Progress Notes (Signed)
Primary Care Physician: Lavone Orn, MD Referring Physician: [facility] ER f/u   Amber Sims is a 77 y.o. female with a h/o HTN that was seen in the ER 12/30 for new onset Afib. It was documented in the EMS unit but she had returned to [place] by the time she seen in the ER. She was started on xarelto for a chadsvasc score of 4. She states that she was on prednisone about 10 days prior to that, has been recently diagnosed with a brain meningioma  and is also in the midst of being worked up for possible parathyroidism, under more stress than usual. She does not drink alcohol, minimal caffeine, no significant snoring, not tobacco use. No previous echo.. Has not noted any further afib. She is pending getting established with Dr. Einar Gip.  Today, she denies symptoms of palpitations, chest pain, shortness of breath, orthopnea, PND, lower extremity edema, dizziness, presyncope, syncope, or neurologic sequela. The patient is tolerating medications without difficulties and is otherwise without complaint today.   Past Medical History:  Diagnosis Date  . Allergy    occasionally   . Anxiety    closterphobia  . Arthritis    knees and thumbs   . Cataract    bilaterally and removed  . Colon polyps   . Eye pressure    elevated eye pressure because of thickened cornea but monitoring for glaucona  . GERD (gastroesophageal reflux disease)   . Hyperlipidemia   . Hypertension   . IBS (irritable bowel syndrome)   . Osteopenia   . Other fracture of right great toe, subsequent encounter for fracture with routine healing   . Sciatica    Past Surgical History:  Procedure Laterality Date  . APPENDECTOMY    . COLONOSCOPY    . DILATION AND CURETTAGE OF UTERUS     x 6   . ENDOMETRIAL ABLATION    . EXPLORATORY LAPAROTOMY    . POLYPECTOMY    . TONSILLECTOMY      Current Outpatient Medications  Medication Sig Dispense Refill  . cholecalciferol (VITAMIN D) 1000 UNITS tablet Take 1,000 Units by mouth daily.      Marland Kitchen esomeprazole (NEXIUM) 20 MG capsule Take 20 mg by mouth as needed.    . rivaroxaban (XARELTO) 20 MG TABS tablet Take 1 tablet (20 mg total) by mouth daily with supper. 30 tablet 1  . triamterene-hydrochlorothiazide (MAXZIDE-25) 37.5-25 MG tablet Take 1 tablet by mouth daily.     Marland Kitchen diltiazem (CARDIZEM) 30 MG tablet Take 1 tablet every 4 hours AS NEEDED for AFIB heart rate over 100 45 tablet 1   Current Facility-Administered Medications  Medication Dose Route Frequency Provider Last Rate Last Dose  . 0.9 %  sodium chloride infusion  500 mL Intravenous Continuous Irene Shipper, MD      . betamethasone acetate-betamethasone sodium phosphate (CELESTONE) injection 12 mg  12 mg Intramuscular Once Edrick Kins, DPM        Allergies  Allergen Reactions  . Caltrate [Calcium Carbonate]     constipation  . Fosamax [Alendronate Sodium] Other (See Comments)    indegestion   . Statins   . Timolol   . Welchol [Colesevelam Hcl]   . Zetia [Ezetimibe]     Social History   Socioeconomic History  . Marital status: Married    Spouse name: Not on file  . Number of children: Not on file  . Years of education: Not on file  . Highest education level: Not  on file  Social Needs  . Financial resource strain: Not on file  . Food insecurity - worry: Not on file  . Food insecurity - inability: Not on file  . Transportation needs - medical: Not on file  . Transportation needs - non-medical: Not on file  Occupational History  . Not on file  Tobacco Use  . Smoking status: Never Smoker  . Smokeless tobacco: Never Used  Substance and Sexual Activity  . Alcohol use: No  . Drug use: No  . Sexual activity: Not on file  Other Topics Concern  . Not on file  Social History Narrative  . Not on file    Family History  Problem Relation Age of Onset  . Dementia Mother   . Cancer - Lung Brother   . Dementia Brother   . Colon polyps Brother   . Colon cancer Neg Hx   . Rectal cancer Neg Hx   .  Stomach cancer Neg Hx     ROS- All systems are reviewed and negative except as per the HPI above  Physical Exam: Vitals:   09/08/17 0847  BP: 122/70  Pulse: 80  Weight: 235 lb (106.6 kg)  Height: 5\' 4"  (1.626 m)   Wt Readings from Last 3 Encounters:  09/08/17 235 lb (106.6 kg)  08/31/17 237 lb 8 oz (107.7 kg)  05/16/16 230 lb (104.3 kg)    Labs: Lab Results  Component Value Date   NA 140 08/31/2017   K 3.1 (L) 08/31/2017   CL 106 08/31/2017   CO2 24 08/31/2017   GLUCOSE 120 (H) 08/31/2017   BUN 26 (H) 08/31/2017   CREATININE 1.02 (H) 08/31/2017   CALCIUM 10.0 08/31/2017   MG 2.0 08/31/2017   No results found for: INR No results found for: CHOL, HDL, LDLCALC, TRIG   GEN- The patient is well appearing, alert and oriented x 3 today.   Head- normocephalic, atraumatic Eyes-  Sclera clear, conjunctiva pink Ears- hearing intact Oropharynx- clear Neck- supple, no JVP Lymph- no cervical lymphadenopathy Lungs- Clear to ausculation bilaterally, normal work of breathing Heart- Regular rate and rhythm, no murmurs, rubs or gallops, PMI not laterally displaced GI- soft, NT, ND, + BS Extremities- no clubbing, cyanosis, or edema MS- no significant deformity or atrophy Skin- no rash or lesion Psych- euthymic mood, full affect Neuro- strength and sensation are intact  EKG-NSR at 80 bpm, pr int 138 ms, qrs int 72 ms, qtc 440 ms Epic records reviewed EMS strips reviewed that show afib with RVR     Assessment and Plan: 1. New onset afib General education re afib Triggers discussed I don't think daily rate control is needed at this time unless burden increases She will need an echo but is in the process of establishing with Dr. Einar Gip Continue xarelto at 20 mg correctly dosed with crcl cal at 80.19  Pt with new dx of brain meningioma and new to anticoagulation,will message Dr. Saintclair Halsted to ask if this finding makes her at risk for  cranial bleed    2. HTN Stable   Other wise,  f/u with Dr. Einar Gip  as planned afib clinic as needed   Geroge Baseman. Brayson Livesey, [facility] [address] [place], [place] [address] [phone]

## 2017-09-08 NOTE — Patient Instructions (Signed)
Your physician has recommended you make the following change in your medication:  1)Cardizem 30mg  -- take 1 tablet every 4 hours AS NEEDED for AFIB heart rate >100 as long as blood pressure >100.

## 2017-09-10 DIAGNOSIS — E876 Hypokalemia: Secondary | ICD-10-CM | POA: Diagnosis not present

## 2017-09-10 DIAGNOSIS — I480 Paroxysmal atrial fibrillation: Secondary | ICD-10-CM | POA: Diagnosis not present

## 2017-09-19 DIAGNOSIS — Z5181 Encounter for therapeutic drug level monitoring: Secondary | ICD-10-CM | POA: Diagnosis not present

## 2017-09-24 DIAGNOSIS — Z8679 Personal history of other diseases of the circulatory system: Secondary | ICD-10-CM | POA: Diagnosis not present

## 2017-09-24 DIAGNOSIS — E7800 Pure hypercholesterolemia, unspecified: Secondary | ICD-10-CM | POA: Diagnosis not present

## 2017-09-24 DIAGNOSIS — D329 Benign neoplasm of meninges, unspecified: Secondary | ICD-10-CM | POA: Diagnosis not present

## 2017-09-24 DIAGNOSIS — I10 Essential (primary) hypertension: Secondary | ICD-10-CM | POA: Diagnosis not present

## 2017-09-25 DIAGNOSIS — I10 Essential (primary) hypertension: Secondary | ICD-10-CM | POA: Diagnosis not present

## 2017-09-25 DIAGNOSIS — I4891 Unspecified atrial fibrillation: Secondary | ICD-10-CM | POA: Diagnosis not present

## 2017-09-25 DIAGNOSIS — M179 Osteoarthritis of knee, unspecified: Secondary | ICD-10-CM | POA: Diagnosis not present

## 2017-09-25 DIAGNOSIS — I480 Paroxysmal atrial fibrillation: Secondary | ICD-10-CM | POA: Diagnosis not present

## 2017-09-26 DIAGNOSIS — H26493 Other secondary cataract, bilateral: Secondary | ICD-10-CM | POA: Diagnosis not present

## 2017-09-26 DIAGNOSIS — D320 Benign neoplasm of cerebral meninges: Secondary | ICD-10-CM | POA: Diagnosis not present

## 2017-09-26 DIAGNOSIS — Z961 Presence of intraocular lens: Secondary | ICD-10-CM | POA: Diagnosis not present

## 2017-09-26 DIAGNOSIS — H40053 Ocular hypertension, bilateral: Secondary | ICD-10-CM | POA: Diagnosis not present

## 2017-09-29 DIAGNOSIS — Z8679 Personal history of other diseases of the circulatory system: Secondary | ICD-10-CM | POA: Diagnosis not present

## 2017-09-29 DIAGNOSIS — I10 Essential (primary) hypertension: Secondary | ICD-10-CM | POA: Diagnosis not present

## 2017-10-02 ENCOUNTER — Other Ambulatory Visit (HOSPITAL_COMMUNITY): Payer: Self-pay | Admitting: Neurosurgery

## 2017-10-02 DIAGNOSIS — D329 Benign neoplasm of meninges, unspecified: Secondary | ICD-10-CM

## 2017-10-07 DIAGNOSIS — Z8679 Personal history of other diseases of the circulatory system: Secondary | ICD-10-CM | POA: Diagnosis not present

## 2017-10-07 DIAGNOSIS — I10 Essential (primary) hypertension: Secondary | ICD-10-CM | POA: Diagnosis not present

## 2017-10-08 DIAGNOSIS — H5712 Ocular pain, left eye: Secondary | ICD-10-CM | POA: Diagnosis not present

## 2017-10-08 DIAGNOSIS — H18832 Recurrent erosion of cornea, left eye: Secondary | ICD-10-CM | POA: Diagnosis not present

## 2017-10-10 ENCOUNTER — Ambulatory Visit (HOSPITAL_COMMUNITY)
Admission: RE | Admit: 2017-10-10 | Discharge: 2017-10-10 | Disposition: A | Discharge status: Home / Self Care | Payer: PPO | Source: Ambulatory Visit | Attending: Neurosurgery | Admitting: Neurosurgery

## 2017-10-10 DIAGNOSIS — D329 Benign neoplasm of meninges, unspecified: Secondary | ICD-10-CM | POA: Diagnosis not present

## 2017-10-10 DIAGNOSIS — D320 Benign neoplasm of cerebral meninges: Secondary | ICD-10-CM | POA: Diagnosis not present

## 2017-10-10 MED ORDER — GADOBENATE DIMEGLUMINE 529 MG/ML IV SOLN
20.0000 mL | Freq: Once | INTRAVENOUS | Status: AC
  Administered 2017-10-10: 20 mL via INTRAVENOUS

## 2017-10-14 DIAGNOSIS — D329 Benign neoplasm of meninges, unspecified: Secondary | ICD-10-CM | POA: Diagnosis not present

## 2017-10-16 DIAGNOSIS — I480 Paroxysmal atrial fibrillation: Secondary | ICD-10-CM | POA: Diagnosis not present

## 2017-10-16 DIAGNOSIS — D329 Benign neoplasm of meninges, unspecified: Secondary | ICD-10-CM | POA: Diagnosis not present

## 2017-10-16 DIAGNOSIS — I10 Essential (primary) hypertension: Secondary | ICD-10-CM | POA: Diagnosis not present

## 2017-10-23 DIAGNOSIS — M791 Myalgia, unspecified site: Secondary | ICD-10-CM | POA: Diagnosis not present

## 2017-10-23 DIAGNOSIS — Z5181 Encounter for therapeutic drug level monitoring: Secondary | ICD-10-CM | POA: Diagnosis not present

## 2017-10-23 DIAGNOSIS — M8588 Other specified disorders of bone density and structure, other site: Secondary | ICD-10-CM | POA: Diagnosis not present

## 2017-10-23 DIAGNOSIS — E7800 Pure hypercholesterolemia, unspecified: Secondary | ICD-10-CM | POA: Diagnosis not present

## 2017-10-30 DIAGNOSIS — I4891 Unspecified atrial fibrillation: Secondary | ICD-10-CM | POA: Diagnosis not present

## 2017-10-30 DIAGNOSIS — I10 Essential (primary) hypertension: Secondary | ICD-10-CM | POA: Diagnosis not present

## 2017-10-30 DIAGNOSIS — M179 Osteoarthritis of knee, unspecified: Secondary | ICD-10-CM | POA: Diagnosis not present

## 2017-10-30 DIAGNOSIS — I480 Paroxysmal atrial fibrillation: Secondary | ICD-10-CM | POA: Diagnosis not present

## 2017-11-17 DIAGNOSIS — Z1231 Encounter for screening mammogram for malignant neoplasm of breast: Secondary | ICD-10-CM | POA: Diagnosis not present

## 2017-11-20 DIAGNOSIS — D1801 Hemangioma of skin and subcutaneous tissue: Secondary | ICD-10-CM | POA: Diagnosis not present

## 2017-11-20 DIAGNOSIS — D2262 Melanocytic nevi of left upper limb, including shoulder: Secondary | ICD-10-CM | POA: Diagnosis not present

## 2017-11-20 DIAGNOSIS — L821 Other seborrheic keratosis: Secondary | ICD-10-CM | POA: Diagnosis not present

## 2017-11-20 DIAGNOSIS — D2272 Melanocytic nevi of left lower limb, including hip: Secondary | ICD-10-CM | POA: Diagnosis not present

## 2017-11-20 DIAGNOSIS — L918 Other hypertrophic disorders of the skin: Secondary | ICD-10-CM | POA: Diagnosis not present

## 2017-11-20 DIAGNOSIS — D2271 Melanocytic nevi of right lower limb, including hip: Secondary | ICD-10-CM | POA: Diagnosis not present

## 2017-11-20 DIAGNOSIS — L814 Other melanin hyperpigmentation: Secondary | ICD-10-CM | POA: Diagnosis not present

## 2017-11-20 DIAGNOSIS — L720 Epidermal cyst: Secondary | ICD-10-CM | POA: Diagnosis not present

## 2017-12-01 DIAGNOSIS — R5383 Other fatigue: Secondary | ICD-10-CM | POA: Diagnosis not present

## 2017-12-01 DIAGNOSIS — I480 Paroxysmal atrial fibrillation: Secondary | ICD-10-CM | POA: Diagnosis not present

## 2017-12-12 DIAGNOSIS — M170 Bilateral primary osteoarthritis of knee: Secondary | ICD-10-CM | POA: Diagnosis not present

## 2017-12-12 DIAGNOSIS — M1711 Unilateral primary osteoarthritis, right knee: Secondary | ICD-10-CM | POA: Diagnosis not present

## 2017-12-29 DIAGNOSIS — I10 Essential (primary) hypertension: Secondary | ICD-10-CM | POA: Diagnosis not present

## 2017-12-29 DIAGNOSIS — I480 Paroxysmal atrial fibrillation: Secondary | ICD-10-CM | POA: Diagnosis not present

## 2017-12-29 DIAGNOSIS — M179 Osteoarthritis of knee, unspecified: Secondary | ICD-10-CM | POA: Diagnosis not present

## 2018-01-01 DIAGNOSIS — Z01419 Encounter for gynecological examination (general) (routine) without abnormal findings: Secondary | ICD-10-CM | POA: Diagnosis not present

## 2018-01-01 DIAGNOSIS — Z6841 Body Mass Index (BMI) 40.0 and over, adult: Secondary | ICD-10-CM | POA: Diagnosis not present

## 2018-01-29 DIAGNOSIS — H903 Sensorineural hearing loss, bilateral: Secondary | ICD-10-CM | POA: Diagnosis not present

## 2018-01-29 DIAGNOSIS — H6123 Impacted cerumen, bilateral: Secondary | ICD-10-CM | POA: Diagnosis not present

## 2018-02-19 DIAGNOSIS — M65322 Trigger finger, left index finger: Secondary | ICD-10-CM | POA: Diagnosis not present

## 2018-02-19 DIAGNOSIS — Z6841 Body Mass Index (BMI) 40.0 and over, adult: Secondary | ICD-10-CM | POA: Diagnosis not present

## 2018-02-19 DIAGNOSIS — M4696 Unspecified inflammatory spondylopathy, lumbar region: Secondary | ICD-10-CM | POA: Diagnosis not present

## 2018-02-19 DIAGNOSIS — E210 Primary hyperparathyroidism: Secondary | ICD-10-CM | POA: Diagnosis not present

## 2018-02-19 DIAGNOSIS — Z0000 Encounter for general adult medical examination without abnormal findings: Secondary | ICD-10-CM | POA: Diagnosis not present

## 2018-02-19 DIAGNOSIS — I10 Essential (primary) hypertension: Secondary | ICD-10-CM | POA: Diagnosis not present

## 2018-02-19 DIAGNOSIS — I872 Venous insufficiency (chronic) (peripheral): Secondary | ICD-10-CM | POA: Diagnosis not present

## 2018-02-19 DIAGNOSIS — K219 Gastro-esophageal reflux disease without esophagitis: Secondary | ICD-10-CM | POA: Diagnosis not present

## 2018-02-19 DIAGNOSIS — D329 Benign neoplasm of meninges, unspecified: Secondary | ICD-10-CM | POA: Diagnosis not present

## 2018-02-19 DIAGNOSIS — M65342 Trigger finger, left ring finger: Secondary | ICD-10-CM | POA: Diagnosis not present

## 2018-02-19 DIAGNOSIS — Z1389 Encounter for screening for other disorder: Secondary | ICD-10-CM | POA: Diagnosis not present

## 2018-02-19 DIAGNOSIS — E7800 Pure hypercholesterolemia, unspecified: Secondary | ICD-10-CM | POA: Diagnosis not present

## 2018-02-19 DIAGNOSIS — I480 Paroxysmal atrial fibrillation: Secondary | ICD-10-CM | POA: Diagnosis not present

## 2018-02-23 DIAGNOSIS — E7800 Pure hypercholesterolemia, unspecified: Secondary | ICD-10-CM | POA: Diagnosis not present

## 2018-02-23 DIAGNOSIS — I10 Essential (primary) hypertension: Secondary | ICD-10-CM | POA: Diagnosis not present

## 2018-02-23 DIAGNOSIS — M179 Osteoarthritis of knee, unspecified: Secondary | ICD-10-CM | POA: Diagnosis not present

## 2018-02-23 DIAGNOSIS — I480 Paroxysmal atrial fibrillation: Secondary | ICD-10-CM | POA: Diagnosis not present

## 2018-03-26 DIAGNOSIS — H26493 Other secondary cataract, bilateral: Secondary | ICD-10-CM | POA: Diagnosis not present

## 2018-03-26 DIAGNOSIS — Z961 Presence of intraocular lens: Secondary | ICD-10-CM | POA: Diagnosis not present

## 2018-03-26 DIAGNOSIS — H40013 Open angle with borderline findings, low risk, bilateral: Secondary | ICD-10-CM | POA: Diagnosis not present

## 2018-03-26 DIAGNOSIS — H40053 Ocular hypertension, bilateral: Secondary | ICD-10-CM | POA: Diagnosis not present

## 2018-04-01 ENCOUNTER — Other Ambulatory Visit (HOSPITAL_COMMUNITY): Payer: Self-pay | Admitting: Neurosurgery

## 2018-04-01 DIAGNOSIS — D329 Benign neoplasm of meninges, unspecified: Secondary | ICD-10-CM

## 2018-04-13 ENCOUNTER — Ambulatory Visit (HOSPITAL_COMMUNITY)
Admission: RE | Admit: 2018-04-13 | Discharge: 2018-04-13 | Disposition: A | Discharge status: Home / Self Care | Payer: PPO | Source: Ambulatory Visit | Attending: Neurosurgery | Admitting: Neurosurgery

## 2018-04-13 DIAGNOSIS — D329 Benign neoplasm of meninges, unspecified: Secondary | ICD-10-CM | POA: Insufficient documentation

## 2018-04-13 LAB — CREATININE, SERUM
CREATININE: 0.89 mg/dL (ref 0.44–1.00)
GFR calc Af Amer: >60 mL/min (ref >60)

## 2018-04-13 MED ORDER — GADOBENATE DIMEGLUMINE 529 MG/ML IV SOLN
20.0000 mL | Freq: Once | INTRAVENOUS | Status: AC
  Administered 2018-04-13: 20 mL via INTRAVENOUS

## 2018-04-14 DIAGNOSIS — D329 Benign neoplasm of meninges, unspecified: Secondary | ICD-10-CM | POA: Diagnosis not present

## 2018-04-20 DIAGNOSIS — R600 Localized edema: Secondary | ICD-10-CM | POA: Diagnosis not present

## 2018-04-20 DIAGNOSIS — I10 Essential (primary) hypertension: Secondary | ICD-10-CM | POA: Diagnosis not present

## 2018-04-20 DIAGNOSIS — D329 Benign neoplasm of meninges, unspecified: Secondary | ICD-10-CM | POA: Diagnosis not present

## 2018-04-20 DIAGNOSIS — I480 Paroxysmal atrial fibrillation: Secondary | ICD-10-CM | POA: Diagnosis not present

## 2018-04-30 DIAGNOSIS — M79645 Pain in left finger(s): Secondary | ICD-10-CM | POA: Diagnosis not present

## 2018-04-30 DIAGNOSIS — M6530 Trigger finger, unspecified finger: Secondary | ICD-10-CM | POA: Diagnosis not present

## 2018-05-18 DIAGNOSIS — Z23 Encounter for immunization: Secondary | ICD-10-CM | POA: Diagnosis not present

## 2018-06-08 DIAGNOSIS — I480 Paroxysmal atrial fibrillation: Secondary | ICD-10-CM | POA: Diagnosis not present

## 2018-06-08 DIAGNOSIS — K219 Gastro-esophageal reflux disease without esophagitis: Secondary | ICD-10-CM | POA: Diagnosis not present

## 2018-06-08 DIAGNOSIS — I10 Essential (primary) hypertension: Secondary | ICD-10-CM | POA: Diagnosis not present

## 2018-06-08 DIAGNOSIS — R252 Cramp and spasm: Secondary | ICD-10-CM | POA: Diagnosis not present

## 2018-06-08 DIAGNOSIS — M2550 Pain in unspecified joint: Secondary | ICD-10-CM | POA: Diagnosis not present

## 2018-06-22 DIAGNOSIS — I10 Essential (primary) hypertension: Secondary | ICD-10-CM | POA: Diagnosis not present

## 2018-06-22 DIAGNOSIS — R609 Edema, unspecified: Secondary | ICD-10-CM | POA: Diagnosis not present

## 2018-06-24 ENCOUNTER — Telehealth: Payer: Self-pay | Admitting: Cardiology

## 2018-06-24 NOTE — Telephone Encounter (Signed)
Received records from [facility] on 07/25/18, Appt 07/13/18 @ 1:40PM. [place]

## 2018-06-26 DIAGNOSIS — I10 Essential (primary) hypertension: Secondary | ICD-10-CM | POA: Diagnosis not present

## 2018-06-26 DIAGNOSIS — M179 Osteoarthritis of knee, unspecified: Secondary | ICD-10-CM | POA: Diagnosis not present

## 2018-06-26 DIAGNOSIS — I480 Paroxysmal atrial fibrillation: Secondary | ICD-10-CM | POA: Diagnosis not present

## 2018-07-01 NOTE — Progress Notes (Signed)
Amber Bear, MD Reason for referral-atrial fibrillation  HPI: 77 year old female for evaluation of atrial fibrillation at request of Lavone Orn, MD.  Patient seen in the emergency room December 2018 with atrial fibrillation.  She converted to sinus rhythm spontaneously.  Patient treated with Cardizem and Xarelto.  TSH was normal.  Seen by Dr. Einar Gip and nuclear study January 2019 showed no ischemia and echocardiogram showed normal ejection fraction and mild mitral regurgitation.  MRI August 2019 showed stable right paraclinoid meningioma encasing the right supraclinoid ICA, right proximal MCA and right proximal ACA.  Stable medial mass-effect on right optic chiasm and prechiasmatic optic nerve.  Since last seen she denies dyspnea, chest pain, palpitations, syncope or bleeding.  Current Outpatient Medications  Medication Sig Dispense Refill  . apixaban (ELIQUIS) 5 MG TABS tablet Take 5 mg by mouth 2 (two) times daily.    . cholecalciferol (VITAMIN D) 1000 UNITS tablet Take 1,000 Units by mouth daily.     Marland Kitchen diltiazem (CARDIZEM) 30 MG tablet Take 1 tablet every 4 hours AS NEEDED for AFIB heart rate over 100 45 tablet 1  . esomeprazole (NEXIUM) 20 MG capsule Take 20 mg by mouth as needed.    . Famotidine (PEPCID PO) Take 1 tablet by mouth daily.    Marland Kitchen LORazepam (ATIVAN) 1 MG tablet Take 1 mg by mouth as needed for anxiety.    . potassium chloride (KLOR-CON) 20 MEQ packet Take 20 mEq by mouth daily.    Marland Kitchen triamterene-hydrochlorothiazide (MAXZIDE-25) 37.5-25 MG tablet Take 1 tablet by mouth daily.      No current facility-administered medications for this visit.     Allergies  Allergen Reactions  . Caltrate [Calcium Carbonate]     constipation  . Fosamax [Alendronate Sodium] Other (See Comments)    indegestion   . Statins   . Timolol   . Welchol [Colesevelam Hcl]   . Zetia [Ezetimibe]      Past Medical History:  Diagnosis Date  . Allergy    occasionally   . Anxiety    closterphobia  . Arthritis    knees and thumbs   . Cataract    bilaterally and removed  . Colon polyps   . Eye pressure    elevated eye pressure because of thickened cornea but monitoring for glaucona  . GERD (gastroesophageal reflux disease)   . Hyperlipidemia   . Hypertension   . IBS (irritable bowel syndrome)   . Osteopenia   . Other fracture of right great toe, subsequent encounter for fracture with routine healing   . Sciatica     Past Surgical History:  Procedure Laterality Date  . APPENDECTOMY    . COLONOSCOPY    . DILATION AND CURETTAGE OF UTERUS     x 6   . ENDOMETRIAL ABLATION    . EXPLORATORY LAPAROTOMY    . POLYPECTOMY    . TONSILLECTOMY      Social History   Socioeconomic History  . Marital status: Married    Spouse name: Not on file  . Number of children: Not on file  . Years of education: Not on file  . Highest education level: Not on file  Occupational History  . Not on file  Social Needs  . Financial resource strain: Not on file  . Food insecurity:    Worry: Not on file    Inability: Not on file  . Transportation needs:    Medical: Not on file    Non-medical: Not on file  Tobacco Use  . Smoking status: Never Smoker  . Smokeless tobacco: Never Used  Substance and Sexual Activity  . Alcohol use: No  . Drug use: No  . Sexual activity: Not on file  Lifestyle  . Physical activity:    Days per week: Not on file    Minutes per session: Not on file  . Stress: Not on file  Relationships  . Social connections:    Talks on phone: Not on file    Gets together: Not on file    Attends religious service: Not on file    Active member of club or organization: Not on file    Attends meetings of clubs or organizations: Not on file    Relationship status: Not on file  . Intimate partner violence:    Fear of current or ex partner: Not on file    Emotionally abused: Not on file    Physically abused: Not on file    Forced sexual activity: Not on file    Other Topics Concern  . Not on file  Social History Narrative  . Not on file    Family History  Problem Relation Age of Onset  . Dementia Mother   . Cancer - Lung Brother   . Dementia Brother   . Colon polyps Brother   . Colon cancer Neg Hx   . Rectal cancer Neg Hx   . Stomach cancer Neg Hx     ROS: no fevers or chills, productive cough, hemoptysis, dysphasia, odynophagia, melena, hematochezia, dysuria, hematuria, rash, seizure activity, orthopnea, PND, pedal edema, claudication. Remaining systems are negative.  Physical Exam:   Blood pressure 130/80, pulse 86, height 5' 2.5" (1.588 m), weight 239 lb (108.4 kg).  General:  Well developed/well nourished in NAD Skin warm/dry Patient not depressed No peripheral clubbing Back-normal HEENT-normal/normal eyelids Neck supple/normal carotid upstroke bilaterally; no bruits; no JVD; no thyromegaly chest - CTA/ normal expansion CV - RRR/normal S1 and S2; no murmurs, rubs or gallops;  PMI nondisplaced Abdomen -NT/ND, no HSM, no mass, + bowel sounds, no bruit 2+ femoral pulses, no bruits Ext-no edema, chords, 2+ DP Neuro-grossly nonfocal  ECG -normal sinus rhythm at a rate of 86.  No ST changes.  Personally reviewed  A/P  1 paroxysmal atrial fibrillation-patient remains in sinus rhythm today.  Continue Cardizem as needed for rate control if atrial fibrillation recurs.  Continue apixaban. Approximately 20 minutes spent reviewing records prior to patient arrival.  2 hypertension-blood pressure is controlled today.  Continue present medications and follow.  3 hyperlipidemia-managed by primary care.  Kirk Ruths, MD

## 2018-07-13 ENCOUNTER — Ambulatory Visit (INDEPENDENT_AMBULATORY_CARE_PROVIDER_SITE_OTHER): Payer: PPO | Admitting: Cardiology

## 2018-07-13 ENCOUNTER — Encounter: Payer: Self-pay | Admitting: Cardiology

## 2018-07-13 VITALS — BP 130/80 | HR 86 | Ht 62.5 in | Wt 239.0 lb

## 2018-07-13 DIAGNOSIS — E7800 Pure hypercholesterolemia, unspecified: Secondary | ICD-10-CM

## 2018-07-13 DIAGNOSIS — I480 Paroxysmal atrial fibrillation: Secondary | ICD-10-CM

## 2018-07-13 DIAGNOSIS — I10 Essential (primary) hypertension: Secondary | ICD-10-CM

## 2018-07-13 NOTE — Patient Instructions (Signed)
Medication Instructions:  Your physician recommends that you continue on your current medications as directed. Please refer to the Current Medication list given to you today.  If you need a refill on your cardiac medications before your next appointment, please call your pharmacy.   Lab work: None ordered If you have labs (blood work) drawn today and your tests are completely normal, you will receive your results only by: Marland Kitchen MyChart Message (if you have MyChart) OR . A paper copy in the mail If you have any lab test that is abnormal or we need to change your treatment, we will call you to review the results.  Testing/Procedures: None ordered  Follow-Up: At [facility], you and your health needs are our priority.  As part of our continuing mission to provide you with exceptional heart care, we have created designated Provider Care Teams.  These Care Teams include your primary Cardiologist (physician) and Advanced Practice Providers (APPs -  Physician Assistants and Nurse Practitioners) who all work together to provide you with the care you need, when you need it. You will need a follow up appointment in 6 months.  Please call our office 2 months in advance to schedule this appointment.  Dr.Crenshaw or one of the following Advanced Practice Providers on your designated Care Team:   Kerin Ransom, PA-C [place], [place] . Sande Rives, PA-C  Any Other Special Instructions Will Be Listed Below (If Applicable).

## 2018-08-31 DIAGNOSIS — M179 Osteoarthritis of knee, unspecified: Secondary | ICD-10-CM | POA: Diagnosis not present

## 2018-08-31 DIAGNOSIS — I10 Essential (primary) hypertension: Secondary | ICD-10-CM | POA: Diagnosis not present

## 2018-08-31 DIAGNOSIS — I480 Paroxysmal atrial fibrillation: Secondary | ICD-10-CM | POA: Diagnosis not present

## 2018-09-04 ENCOUNTER — Other Ambulatory Visit: Payer: Self-pay | Admitting: Neurological Surgery

## 2018-09-04 ENCOUNTER — Other Ambulatory Visit (HOSPITAL_COMMUNITY): Payer: Self-pay | Admitting: Neurological Surgery

## 2018-09-04 DIAGNOSIS — D329 Benign neoplasm of meninges, unspecified: Secondary | ICD-10-CM

## 2018-09-08 DIAGNOSIS — I480 Paroxysmal atrial fibrillation: Secondary | ICD-10-CM | POA: Diagnosis not present

## 2018-09-08 DIAGNOSIS — K219 Gastro-esophageal reflux disease without esophagitis: Secondary | ICD-10-CM | POA: Diagnosis not present

## 2018-09-08 DIAGNOSIS — M79675 Pain in left toe(s): Secondary | ICD-10-CM | POA: Diagnosis not present

## 2018-09-08 DIAGNOSIS — M79674 Pain in right toe(s): Secondary | ICD-10-CM | POA: Diagnosis not present

## 2018-09-08 DIAGNOSIS — E210 Primary hyperparathyroidism: Secondary | ICD-10-CM | POA: Diagnosis not present

## 2018-09-08 DIAGNOSIS — E559 Vitamin D deficiency, unspecified: Secondary | ICD-10-CM | POA: Diagnosis not present

## 2018-09-08 DIAGNOSIS — I10 Essential (primary) hypertension: Secondary | ICD-10-CM | POA: Diagnosis not present

## 2018-09-08 DIAGNOSIS — E7800 Pure hypercholesterolemia, unspecified: Secondary | ICD-10-CM | POA: Diagnosis not present

## 2018-09-15 DIAGNOSIS — I480 Paroxysmal atrial fibrillation: Secondary | ICD-10-CM | POA: Diagnosis not present

## 2018-09-15 DIAGNOSIS — M179 Osteoarthritis of knee, unspecified: Secondary | ICD-10-CM | POA: Diagnosis not present

## 2018-09-15 DIAGNOSIS — I10 Essential (primary) hypertension: Secondary | ICD-10-CM | POA: Diagnosis not present

## 2018-09-24 DIAGNOSIS — H40053 Ocular hypertension, bilateral: Secondary | ICD-10-CM | POA: Diagnosis not present

## 2018-10-01 DIAGNOSIS — D320 Benign neoplasm of cerebral meninges: Secondary | ICD-10-CM | POA: Diagnosis not present

## 2018-10-01 DIAGNOSIS — H40053 Ocular hypertension, bilateral: Secondary | ICD-10-CM | POA: Diagnosis not present

## 2018-10-01 DIAGNOSIS — H52203 Unspecified astigmatism, bilateral: Secondary | ICD-10-CM | POA: Diagnosis not present

## 2018-10-01 DIAGNOSIS — H43813 Vitreous degeneration, bilateral: Secondary | ICD-10-CM | POA: Diagnosis not present

## 2018-10-12 ENCOUNTER — Ambulatory Visit (HOSPITAL_COMMUNITY)
Admission: RE | Admit: 2018-10-12 | Discharge: 2018-10-12 | Disposition: A | Discharge status: Home / Self Care | Payer: PPO | Source: Ambulatory Visit | Attending: Neurological Surgery | Admitting: Neurological Surgery

## 2018-10-12 DIAGNOSIS — D329 Benign neoplasm of meninges, unspecified: Secondary | ICD-10-CM

## 2018-10-12 DIAGNOSIS — D320 Benign neoplasm of cerebral meninges: Secondary | ICD-10-CM | POA: Diagnosis not present

## 2018-10-12 MED ORDER — GADOBUTROL 1 MMOL/ML IV SOLN
10.0000 mL | Freq: Once | INTRAVENOUS | Status: AC | PRN
  Administered 2018-10-12: 10 mL via INTRAVENOUS

## 2018-10-15 DIAGNOSIS — D329 Benign neoplasm of meninges, unspecified: Secondary | ICD-10-CM | POA: Diagnosis not present

## 2018-10-23 DIAGNOSIS — H18831 Recurrent erosion of cornea, right eye: Secondary | ICD-10-CM | POA: Diagnosis not present

## 2018-11-03 DIAGNOSIS — K120 Recurrent oral aphthae: Secondary | ICD-10-CM | POA: Diagnosis not present

## 2018-11-16 DIAGNOSIS — I10 Essential (primary) hypertension: Secondary | ICD-10-CM | POA: Diagnosis not present

## 2018-11-16 DIAGNOSIS — I480 Paroxysmal atrial fibrillation: Secondary | ICD-10-CM | POA: Diagnosis not present

## 2018-11-16 DIAGNOSIS — E7800 Pure hypercholesterolemia, unspecified: Secondary | ICD-10-CM | POA: Diagnosis not present

## 2018-11-16 DIAGNOSIS — E876 Hypokalemia: Secondary | ICD-10-CM | POA: Diagnosis not present

## 2018-11-16 DIAGNOSIS — R609 Edema, unspecified: Secondary | ICD-10-CM | POA: Diagnosis not present

## 2018-11-20 DIAGNOSIS — I10 Essential (primary) hypertension: Secondary | ICD-10-CM | POA: Diagnosis not present

## 2018-11-20 DIAGNOSIS — M179 Osteoarthritis of knee, unspecified: Secondary | ICD-10-CM | POA: Diagnosis not present

## 2018-11-20 DIAGNOSIS — I480 Paroxysmal atrial fibrillation: Secondary | ICD-10-CM | POA: Diagnosis not present

## 2019-01-11 DIAGNOSIS — I872 Venous insufficiency (chronic) (peripheral): Secondary | ICD-10-CM | POA: Diagnosis not present

## 2019-01-11 DIAGNOSIS — R1013 Epigastric pain: Secondary | ICD-10-CM | POA: Diagnosis not present

## 2019-01-12 ENCOUNTER — Telehealth: Payer: Self-pay

## 2019-01-12 NOTE — Telephone Encounter (Signed)

## 2019-01-13 ENCOUNTER — Telehealth: Payer: Self-pay | Admitting: Cardiology

## 2019-01-13 NOTE — Telephone Encounter (Signed)
Spoke with pt, set up for video visit tomorrow.

## 2019-01-13 NOTE — Telephone Encounter (Signed)
New Message:    Please call, she have some concerns about her Virtual Visit tomorrow.

## 2019-01-13 NOTE — Progress Notes (Signed)
Virtual Visit via Video Note changed to phone visit due to technical difficulties with patient's smart phone.   This visit type was conducted due to national recommendations for restrictions regarding the COVID-19 Pandemic (e.g. social distancing) in an effort to limit this patient's exposure and mitigate transmission in our community.  Due to her co-morbid illnesses, this patient is at least at moderate risk for complications without adequate follow up.  This format is felt to be most appropriate for this patient at this time.  All issues noted in this document were discussed and addressed.  A limited physical exam was performed with this format.  Please refer to the patient's chart for her consent to telehealth for [facility].   Date:  01/14/2019   ID:  Amber Sims, DOB 1940-10-31, MRN [id number]  Patient Location: Home Provider Location: Home  PCP:  Lavone Orn, MD  Cardiologist:  Dr Stanford Breed  Evaluation Performed:  Follow-Up Visit  Chief Complaint:  FU atrial fibrillation  History of Present Illness:    FU atrial fibrillation.  Patient seen in the emergency room December 2018 with atrial fibrillation.  She converted to sinus rhythm spontaneously.  Patient treated with Cardizem and Xarelto.  TSH was normal.  Seen by Dr. Einar Gip and nuclear study January 2019 showed no ischemia and echocardiogram showed normal ejection fraction and mild mitral regurgitation. Since last seen the patient denies any dyspnea on exertion, orthopnea, PND, pedal edema, palpitations, syncope or chest pain.  The patient does not have symptoms concerning for COVID-19 infection (fever, chills, cough, or new shortness of breath).    Past Medical History:  Diagnosis Date  . Allergy    occasionally   . Anxiety    closterphobia  . Arthritis    knees and thumbs   . Cataract    bilaterally and removed  . Colon polyps   . Eye pressure    elevated eye pressure because of thickened cornea but  monitoring for glaucona  . GERD (gastroesophageal reflux disease)   . Hyperlipidemia   . Hypertension   . IBS (irritable bowel syndrome)   . Osteopenia   . Other fracture of right great toe, subsequent encounter for fracture with routine healing   . Sciatica    Past Surgical History:  Procedure Laterality Date  . APPENDECTOMY    . COLONOSCOPY    . DILATION AND CURETTAGE OF UTERUS     x 6   . ENDOMETRIAL ABLATION    . EXPLORATORY LAPAROTOMY    . POLYPECTOMY    . TONSILLECTOMY       Current Meds  Medication Sig  . Carboxymethylcellulose Sod PF (REFRESH PLUS) 0.5 % SOLN 1 drop 3 (three) times daily as needed.  . cholecalciferol (VITAMIN D) 1000 UNITS tablet Take 1,000 Units by mouth daily.   Marland Kitchen diltiazem (CARDIZEM) 30 MG tablet Take 1 tablet every 4 hours AS NEEDED for AFIB heart rate over 100  . esomeprazole (NEXIUM) 20 MG capsule Take 20 mg by mouth as needed.  . Famotidine (PEPCID PO) Take 1 tablet by mouth daily as needed.   . furosemide (LASIX) 20 MG tablet Take 1 tablet by mouth daily as needed for fluid.  Marland Kitchen LORazepam (ATIVAN) 1 MG tablet Take 1 mg by mouth as needed for anxiety.  . potassium chloride (KLOR-CON) 20 MEQ packet Take 20 mEq by mouth daily.  . rosuvastatin (CRESTOR) 5 MG tablet Take 2.5 mg by mouth once a week.  Alveda Reasons 20 MG TABS tablet  Take 1 tablet by mouth daily.     Allergies:   Caltrate [calcium carbonate]; Fosamax [alendronate sodium]; Statins; Timolol; Welchol [colesevelam hcl]; and Zetia [ezetimibe]   Social History   Tobacco Use  . Smoking status: Never Smoker  . Smokeless tobacco: Never Used  Substance Use Topics  . Alcohol use: No  . Drug use: No     Family Hx: The patient's family history includes Cancer - Lung in her brother; Colon polyps in her brother; Dementia in her brother and mother. There is no history of Colon cancer, Rectal cancer, or Stomach cancer.  ROS:   Please see the history of present illness.    No F/C or productive  cough; knee arthralgias present All other systems reviewed and are negative.  Recent Labs: 04/13/2018: Creatinine, Ser 0.89    Wt Readings from Last 3 Encounters:  01/14/19 239 lb (108.4 kg)  04/20/18 234 lb 1 oz (106.2 kg)  07/13/18 239 lb (108.4 kg)     Objective:    Vital Signs:  BP 116/66   Pulse 71   Ht 5\' 2"  (1.575 m)   Wt 239 lb (108.4 kg)   BMI 43.71 kg/m    VITAL SIGNS:  reviewed  No acute distress Normal affect Answers questions appropriately Remainder physical examination not performed (telehealth visit; coronavirus pandemic)  ASSESSMENT & PLAN:    1. Paroxysmal atrial fibrillation-patient has had no recurrences of atrial fibrillation by history.  We will continue with present dose of Cardizem for rate control if atrial fibrillation recurs.  Continue xarelto at present dose.  Hemoglobin and renal function monitored by primary care. 2. Hypertension-patient's blood pressure is controlled.  Continue present medications and follow. 3. Hyperlipidemia-followed by primary care.  COVID-19 Education: The importance of social distancing was discussed today.  Time:   Today, I have spent 12 minutes with the patient with telehealth technology discussing the above problems.     Medication Adjustments/Labs and Tests Ordered: Current medicines are reviewed at length with the patient today.  Concerns regarding medicines are outlined above.   Tests Ordered: No orders of the defined types were placed in this encounter.   Medication Changes: No orders of the defined types were placed in this encounter.   Disposition:  Follow up in 6 month(s)  Signed, Kirk Ruths, MD  01/14/2019 8:30 AM    [name]

## 2019-01-13 NOTE — Telephone Encounter (Signed)
Smartphone/ consent/ my chart via email/ pre reg completed

## 2019-01-14 ENCOUNTER — Encounter: Payer: Self-pay | Admitting: Cardiology

## 2019-01-14 ENCOUNTER — Telehealth (INDEPENDENT_AMBULATORY_CARE_PROVIDER_SITE_OTHER): Payer: PPO | Admitting: Cardiology

## 2019-01-14 VITALS — BP 116/66 | HR 71 | Ht 62.0 in | Wt 239.0 lb

## 2019-01-14 DIAGNOSIS — I480 Paroxysmal atrial fibrillation: Secondary | ICD-10-CM

## 2019-01-14 DIAGNOSIS — E7800 Pure hypercholesterolemia, unspecified: Secondary | ICD-10-CM

## 2019-01-14 DIAGNOSIS — I10 Essential (primary) hypertension: Secondary | ICD-10-CM

## 2019-01-14 NOTE — Patient Instructions (Signed)

## 2019-01-22 DIAGNOSIS — M170 Bilateral primary osteoarthritis of knee: Secondary | ICD-10-CM | POA: Diagnosis not present

## 2019-01-22 DIAGNOSIS — M25562 Pain in left knee: Secondary | ICD-10-CM | POA: Diagnosis not present

## 2019-01-29 DIAGNOSIS — I10 Essential (primary) hypertension: Secondary | ICD-10-CM | POA: Diagnosis not present

## 2019-01-29 DIAGNOSIS — M8580 Other specified disorders of bone density and structure, unspecified site: Secondary | ICD-10-CM | POA: Diagnosis not present

## 2019-01-29 DIAGNOSIS — E7800 Pure hypercholesterolemia, unspecified: Secondary | ICD-10-CM | POA: Diagnosis not present

## 2019-01-29 DIAGNOSIS — M179 Osteoarthritis of knee, unspecified: Secondary | ICD-10-CM | POA: Diagnosis not present

## 2019-01-29 DIAGNOSIS — I480 Paroxysmal atrial fibrillation: Secondary | ICD-10-CM | POA: Diagnosis not present

## 2019-02-25 DIAGNOSIS — Z0000 Encounter for general adult medical examination without abnormal findings: Secondary | ICD-10-CM | POA: Diagnosis not present

## 2019-02-25 DIAGNOSIS — I480 Paroxysmal atrial fibrillation: Secondary | ICD-10-CM | POA: Diagnosis not present

## 2019-02-25 DIAGNOSIS — M179 Osteoarthritis of knee, unspecified: Secondary | ICD-10-CM | POA: Diagnosis not present

## 2019-02-25 DIAGNOSIS — I10 Essential (primary) hypertension: Secondary | ICD-10-CM | POA: Diagnosis not present

## 2019-02-25 DIAGNOSIS — M8580 Other specified disorders of bone density and structure, unspecified site: Secondary | ICD-10-CM | POA: Diagnosis not present

## 2019-02-25 DIAGNOSIS — E7800 Pure hypercholesterolemia, unspecified: Secondary | ICD-10-CM | POA: Diagnosis not present

## 2019-02-25 DIAGNOSIS — Z1389 Encounter for screening for other disorder: Secondary | ICD-10-CM | POA: Diagnosis not present

## 2019-03-10 DIAGNOSIS — Z1231 Encounter for screening mammogram for malignant neoplasm of breast: Secondary | ICD-10-CM | POA: Diagnosis not present

## 2019-03-11 DIAGNOSIS — K219 Gastro-esophageal reflux disease without esophagitis: Secondary | ICD-10-CM | POA: Diagnosis not present

## 2019-03-11 DIAGNOSIS — I480 Paroxysmal atrial fibrillation: Secondary | ICD-10-CM | POA: Diagnosis not present

## 2019-03-11 DIAGNOSIS — I10 Essential (primary) hypertension: Secondary | ICD-10-CM | POA: Diagnosis not present

## 2019-03-11 DIAGNOSIS — I872 Venous insufficiency (chronic) (peripheral): Secondary | ICD-10-CM | POA: Diagnosis not present

## 2019-03-11 DIAGNOSIS — R1011 Right upper quadrant pain: Secondary | ICD-10-CM | POA: Diagnosis not present

## 2019-03-11 DIAGNOSIS — E7800 Pure hypercholesterolemia, unspecified: Secondary | ICD-10-CM | POA: Diagnosis not present

## 2019-03-15 DIAGNOSIS — M8580 Other specified disorders of bone density and structure, unspecified site: Secondary | ICD-10-CM | POA: Diagnosis not present

## 2019-03-15 DIAGNOSIS — M179 Osteoarthritis of knee, unspecified: Secondary | ICD-10-CM | POA: Diagnosis not present

## 2019-03-15 DIAGNOSIS — I480 Paroxysmal atrial fibrillation: Secondary | ICD-10-CM | POA: Diagnosis not present

## 2019-03-15 DIAGNOSIS — E7800 Pure hypercholesterolemia, unspecified: Secondary | ICD-10-CM | POA: Diagnosis not present

## 2019-03-15 DIAGNOSIS — I10 Essential (primary) hypertension: Secondary | ICD-10-CM | POA: Diagnosis not present

## 2019-04-05 DIAGNOSIS — H26493 Other secondary cataract, bilateral: Secondary | ICD-10-CM | POA: Diagnosis not present

## 2019-04-05 DIAGNOSIS — H18831 Recurrent erosion of cornea, right eye: Secondary | ICD-10-CM | POA: Diagnosis not present

## 2019-04-05 DIAGNOSIS — D320 Benign neoplasm of cerebral meninges: Secondary | ICD-10-CM | POA: Diagnosis not present

## 2019-04-05 DIAGNOSIS — H40053 Ocular hypertension, bilateral: Secondary | ICD-10-CM | POA: Diagnosis not present

## 2019-04-06 DIAGNOSIS — D2272 Melanocytic nevi of left lower limb, including hip: Secondary | ICD-10-CM | POA: Diagnosis not present

## 2019-04-06 DIAGNOSIS — L821 Other seborrheic keratosis: Secondary | ICD-10-CM | POA: Diagnosis not present

## 2019-04-06 DIAGNOSIS — L814 Other melanin hyperpigmentation: Secondary | ICD-10-CM | POA: Diagnosis not present

## 2019-04-06 DIAGNOSIS — L720 Epidermal cyst: Secondary | ICD-10-CM | POA: Diagnosis not present

## 2019-04-06 DIAGNOSIS — D1801 Hemangioma of skin and subcutaneous tissue: Secondary | ICD-10-CM | POA: Diagnosis not present

## 2019-04-06 DIAGNOSIS — D2271 Melanocytic nevi of right lower limb, including hip: Secondary | ICD-10-CM | POA: Diagnosis not present

## 2019-04-06 DIAGNOSIS — L918 Other hypertrophic disorders of the skin: Secondary | ICD-10-CM | POA: Diagnosis not present

## 2019-04-06 DIAGNOSIS — D2239 Melanocytic nevi of other parts of face: Secondary | ICD-10-CM | POA: Diagnosis not present

## 2019-04-06 DIAGNOSIS — D2261 Melanocytic nevi of right upper limb, including shoulder: Secondary | ICD-10-CM | POA: Diagnosis not present

## 2019-04-23 DIAGNOSIS — I10 Essential (primary) hypertension: Secondary | ICD-10-CM | POA: Diagnosis not present

## 2019-04-23 DIAGNOSIS — K219 Gastro-esophageal reflux disease without esophagitis: Secondary | ICD-10-CM | POA: Diagnosis not present

## 2019-04-23 DIAGNOSIS — I872 Venous insufficiency (chronic) (peripheral): Secondary | ICD-10-CM | POA: Diagnosis not present

## 2019-04-23 DIAGNOSIS — E7800 Pure hypercholesterolemia, unspecified: Secondary | ICD-10-CM | POA: Diagnosis not present

## 2019-04-29 DIAGNOSIS — I480 Paroxysmal atrial fibrillation: Secondary | ICD-10-CM | POA: Diagnosis not present

## 2019-04-29 DIAGNOSIS — M179 Osteoarthritis of knee, unspecified: Secondary | ICD-10-CM | POA: Diagnosis not present

## 2019-04-29 DIAGNOSIS — I10 Essential (primary) hypertension: Secondary | ICD-10-CM | POA: Diagnosis not present

## 2019-04-29 DIAGNOSIS — M8580 Other specified disorders of bone density and structure, unspecified site: Secondary | ICD-10-CM | POA: Diagnosis not present

## 2019-04-29 DIAGNOSIS — E7800 Pure hypercholesterolemia, unspecified: Secondary | ICD-10-CM | POA: Diagnosis not present

## 2019-05-17 DIAGNOSIS — H18832 Recurrent erosion of cornea, left eye: Secondary | ICD-10-CM | POA: Diagnosis not present

## 2019-05-17 DIAGNOSIS — H5712 Ocular pain, left eye: Secondary | ICD-10-CM | POA: Diagnosis not present

## 2019-05-19 DIAGNOSIS — Z23 Encounter for immunization: Secondary | ICD-10-CM | POA: Diagnosis not present

## 2019-05-20 DIAGNOSIS — Z124 Encounter for screening for malignant neoplasm of cervix: Secondary | ICD-10-CM | POA: Diagnosis not present

## 2019-05-20 DIAGNOSIS — Z6841 Body Mass Index (BMI) 40.0 and over, adult: Secondary | ICD-10-CM | POA: Diagnosis not present

## 2019-05-20 DIAGNOSIS — N3946 Mixed incontinence: Secondary | ICD-10-CM | POA: Diagnosis not present

## 2019-05-31 DIAGNOSIS — M179 Osteoarthritis of knee, unspecified: Secondary | ICD-10-CM | POA: Diagnosis not present

## 2019-05-31 DIAGNOSIS — I10 Essential (primary) hypertension: Secondary | ICD-10-CM | POA: Diagnosis not present

## 2019-05-31 DIAGNOSIS — M8580 Other specified disorders of bone density and structure, unspecified site: Secondary | ICD-10-CM | POA: Diagnosis not present

## 2019-05-31 DIAGNOSIS — I480 Paroxysmal atrial fibrillation: Secondary | ICD-10-CM | POA: Diagnosis not present

## 2019-05-31 DIAGNOSIS — E7800 Pure hypercholesterolemia, unspecified: Secondary | ICD-10-CM | POA: Diagnosis not present

## 2019-06-03 DIAGNOSIS — H6123 Impacted cerumen, bilateral: Secondary | ICD-10-CM | POA: Diagnosis not present

## 2019-06-03 DIAGNOSIS — D329 Benign neoplasm of meninges, unspecified: Secondary | ICD-10-CM | POA: Diagnosis not present

## 2019-06-03 DIAGNOSIS — H608X3 Other otitis externa, bilateral: Secondary | ICD-10-CM | POA: Diagnosis not present

## 2019-06-03 DIAGNOSIS — H903 Sensorineural hearing loss, bilateral: Secondary | ICD-10-CM | POA: Diagnosis not present

## 2019-07-01 DIAGNOSIS — L820 Inflamed seborrheic keratosis: Secondary | ICD-10-CM | POA: Diagnosis not present

## 2019-07-21 IMAGING — CR DG CHEST 2V
2 series · 2 of 2 positions shown · non-contrast
Comparison: None.

CLINICAL DATA: Acute onset of tachycardia.  Atrial fibrillation.

EXAM:
CHEST  2 VIEW

[chest pa]
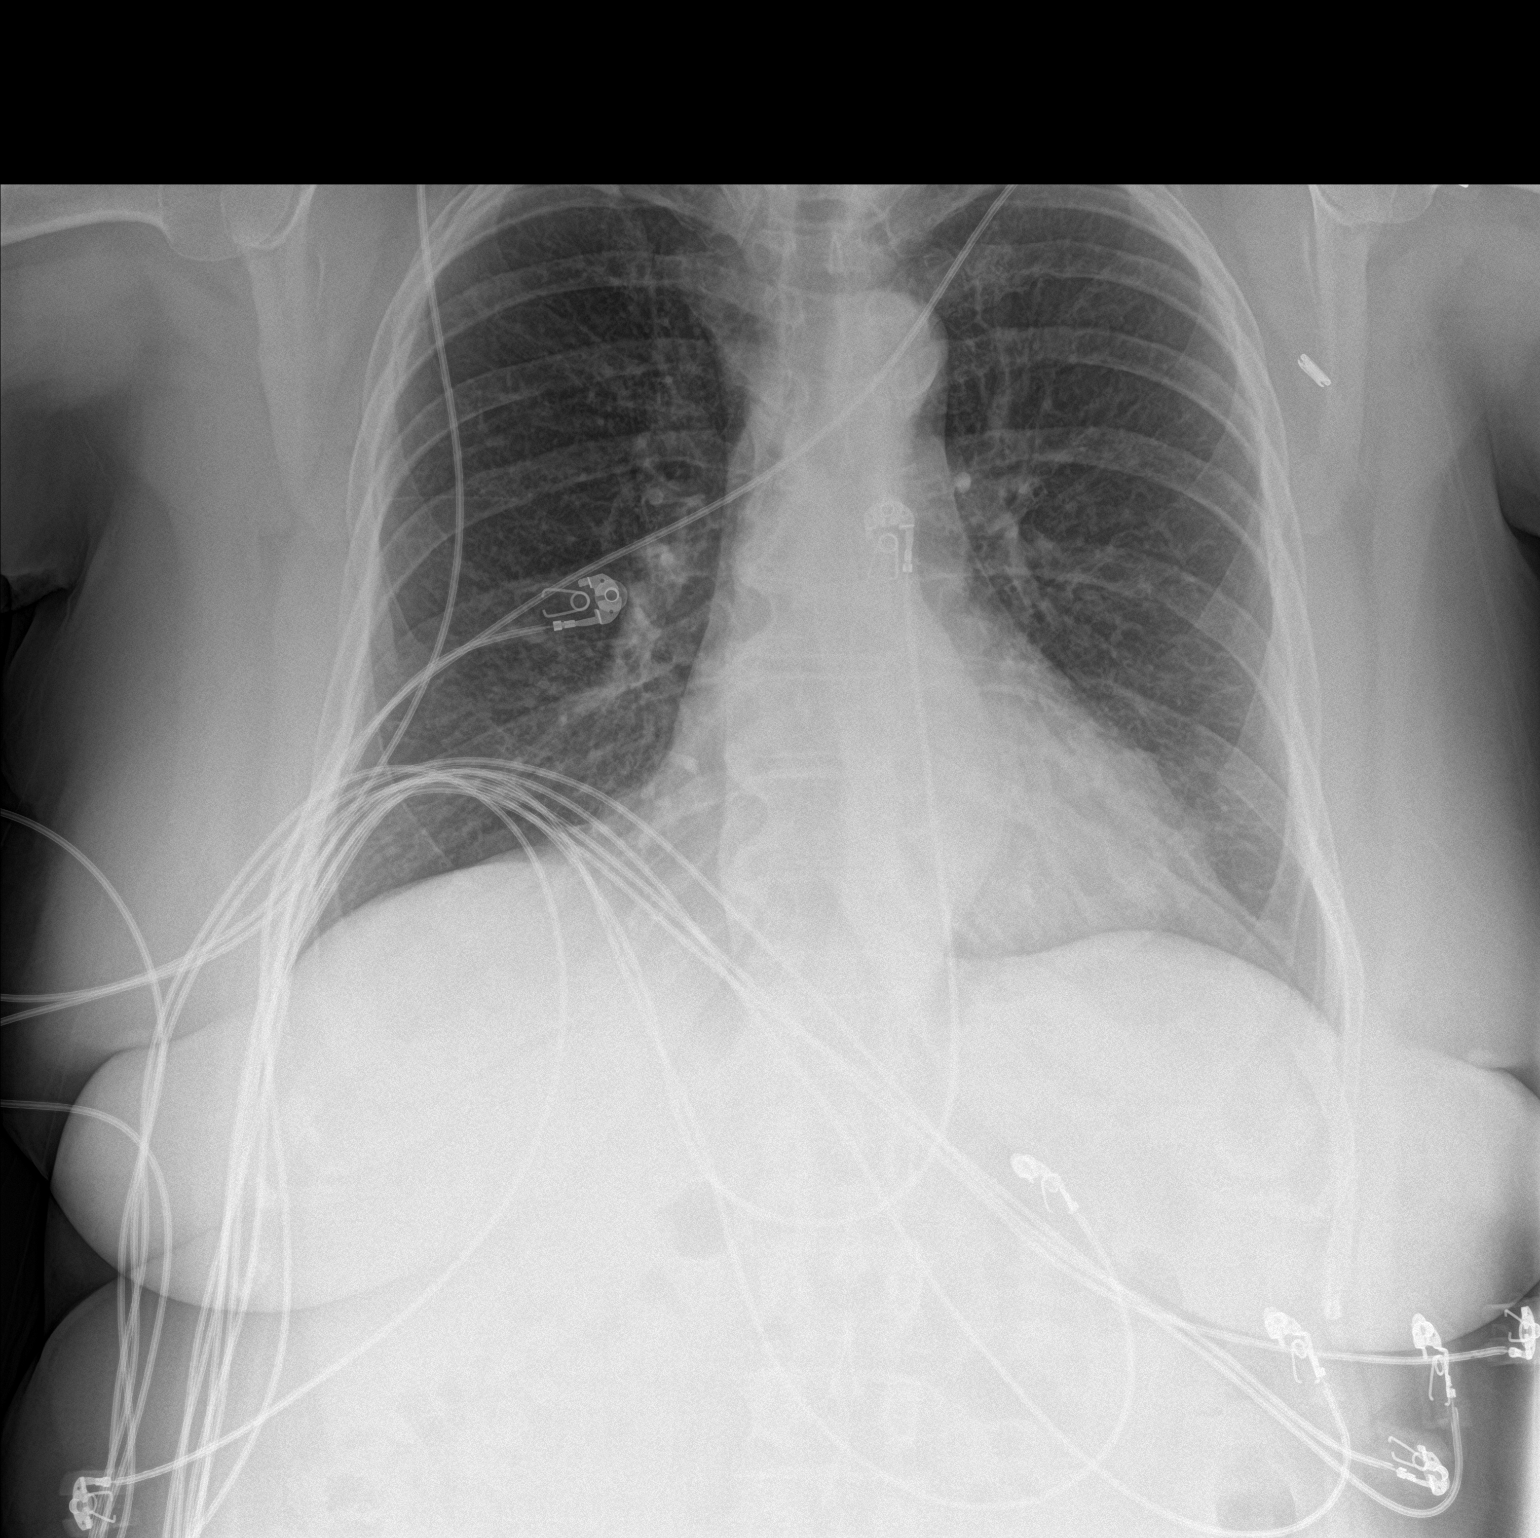

[chest lat]
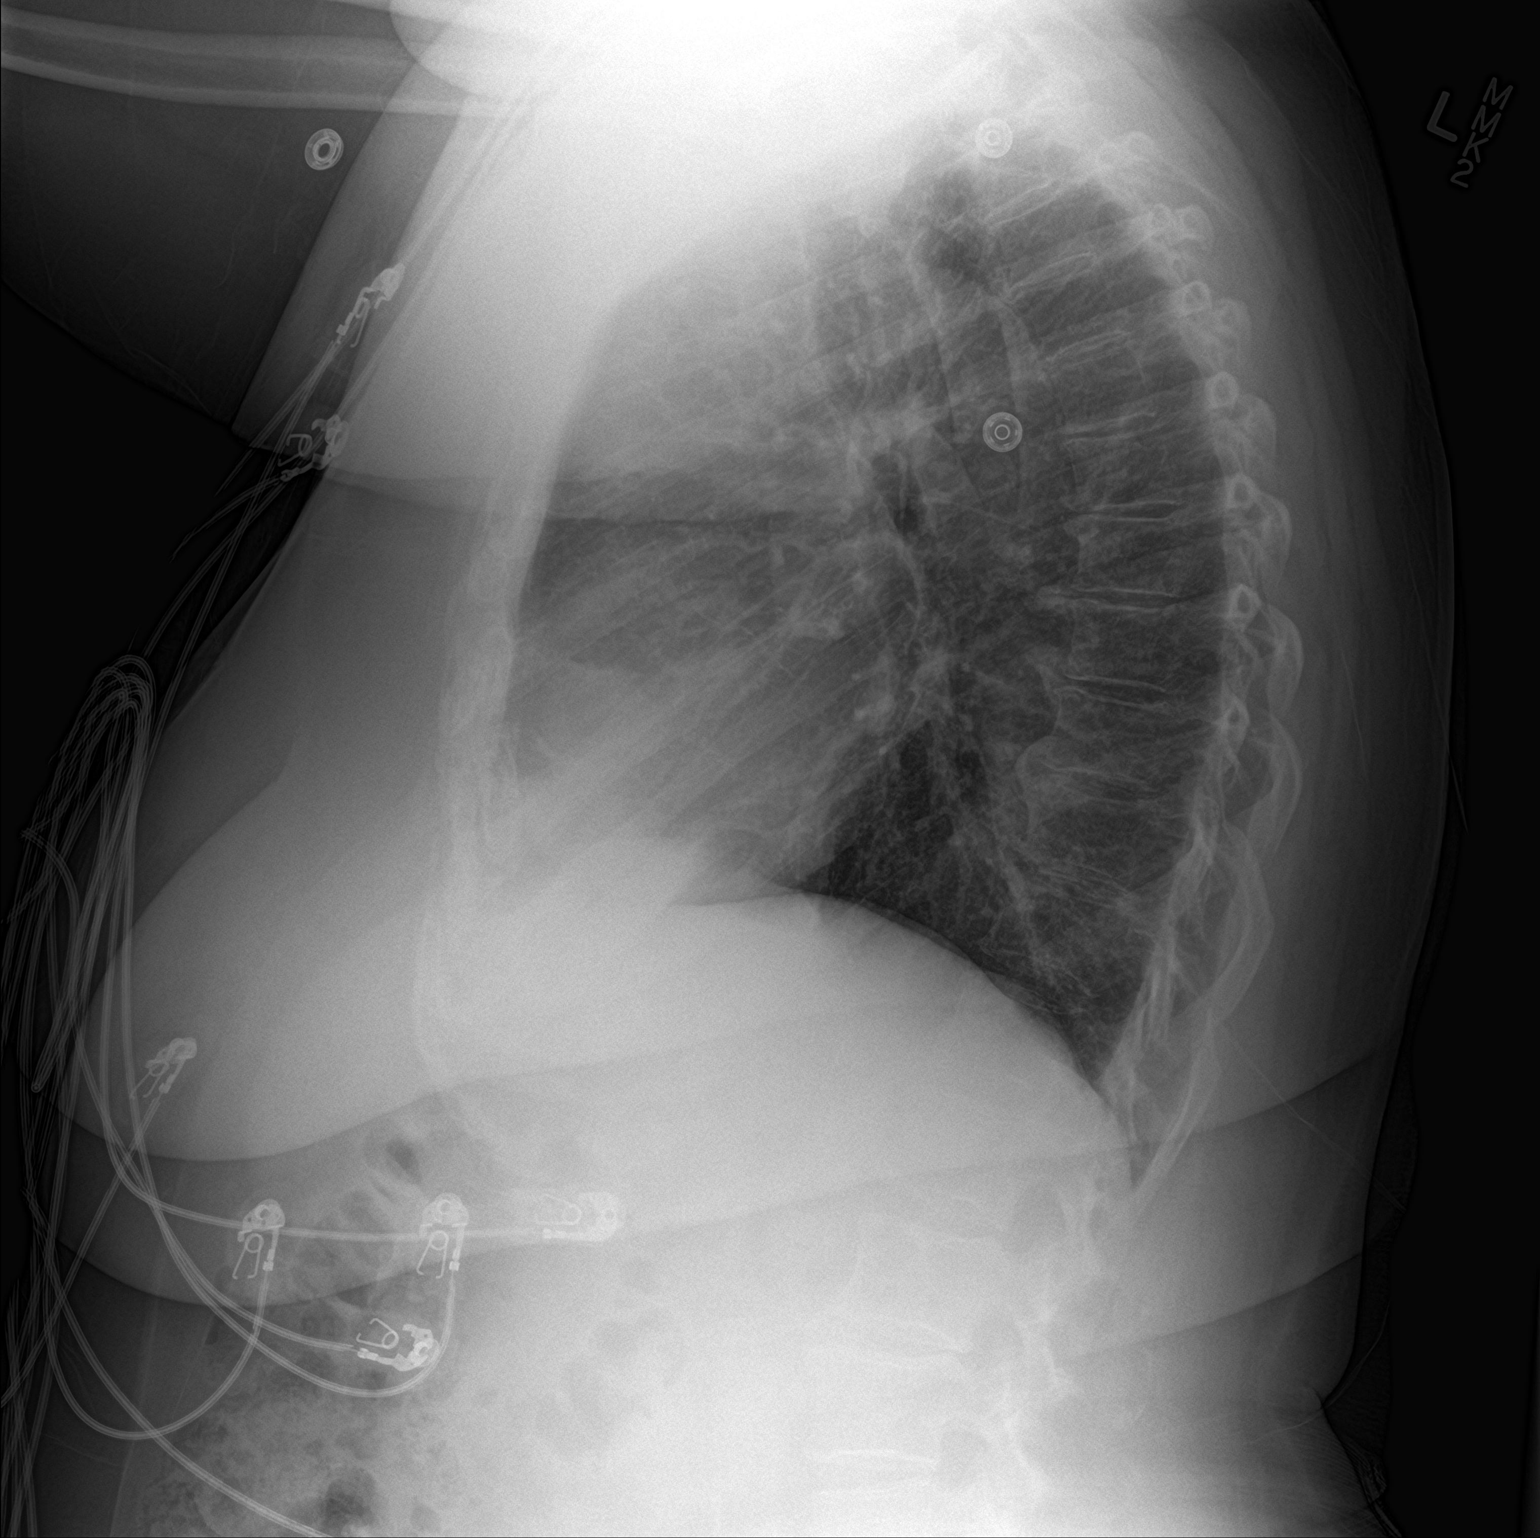

[2 of 2 positions shown; findings below may reference images not displayed]

FINDINGS: The lungs are well-aerated and clear. There is no evidence of focal
opacification, pleural effusion or pneumothorax.

The heart is borderline normal in size. No acute osseous
abnormalities are seen.
IMPRESSION: No acute cardiopulmonary process seen.

## 2019-08-02 DIAGNOSIS — I10 Essential (primary) hypertension: Secondary | ICD-10-CM | POA: Diagnosis not present

## 2019-08-02 DIAGNOSIS — I480 Paroxysmal atrial fibrillation: Secondary | ICD-10-CM | POA: Diagnosis not present

## 2019-08-02 DIAGNOSIS — E7800 Pure hypercholesterolemia, unspecified: Secondary | ICD-10-CM | POA: Diagnosis not present

## 2019-08-02 DIAGNOSIS — M8580 Other specified disorders of bone density and structure, unspecified site: Secondary | ICD-10-CM | POA: Diagnosis not present

## 2019-08-02 DIAGNOSIS — M179 Osteoarthritis of knee, unspecified: Secondary | ICD-10-CM | POA: Diagnosis not present

## 2019-08-02 DIAGNOSIS — N390 Urinary tract infection, site not specified: Secondary | ICD-10-CM | POA: Diagnosis not present

## 2019-08-17 ENCOUNTER — Ambulatory Visit: Payer: PPO | Admitting: Cardiology

## 2019-08-19 ENCOUNTER — Ambulatory Visit (INDEPENDENT_AMBULATORY_CARE_PROVIDER_SITE_OTHER): Payer: PPO | Admitting: Cardiology

## 2019-08-19 ENCOUNTER — Encounter: Payer: Self-pay | Admitting: Cardiology

## 2019-08-19 ENCOUNTER — Other Ambulatory Visit: Payer: Self-pay

## 2019-08-19 VITALS — BP 162/86 | HR 71 | Ht 63.0 in | Wt 225.6 lb

## 2019-08-19 DIAGNOSIS — I10 Essential (primary) hypertension: Secondary | ICD-10-CM | POA: Diagnosis not present

## 2019-08-19 DIAGNOSIS — E7800 Pure hypercholesterolemia, unspecified: Secondary | ICD-10-CM | POA: Diagnosis not present

## 2019-08-19 DIAGNOSIS — I480 Paroxysmal atrial fibrillation: Secondary | ICD-10-CM

## 2019-08-19 NOTE — Patient Instructions (Signed)
Medication Instructions:  NO CHANGE *If you need a refill on your cardiac medications before your next appointment, please call your pharmacy*  Lab Work: Your physician recommends that you HAVE LAB WORK TODAY If you have labs (blood work) drawn today and your tests are completely normal, you will receive your results only by: . MyChart Message (if you have MyChart) OR . A paper copy in the mail If you have any lab test that is abnormal or we need to change your treatment, we will call you to review the results.  Follow-Up: At [facility], you and your health needs are our priority.  As part of our continuing mission to provide you with exceptional heart care, we have created designated Provider Care Teams.  These Care Teams include your primary Cardiologist (physician) and Advanced Practice Providers (APPs -  Physician Assistants and Nurse Practitioners) who all work together to provide you with the care you need, when you need it.  Your next appointment:   6 month(s)  The format for your next appointment:   Either In Person or Virtual  Provider:   You may see BRIAN CRENSHAW MD or one of the following Advanced Practice Providers on your designated Care Team:    Luke Kilroy, PA-C  Callie Goodrich, PA-C  Jesse Cleaver, FNP    

## 2019-08-19 NOTE — Progress Notes (Signed)
HPI: FU atrial fibrillation. Patient seen in the emergency room December 2018 with atrial fibrillation. She converted to sinus rhythm spontaneously. Patient treated with Cardizem and Xarelto. TSH was normal. Seen by Dr. Einar Gip and nuclear study January 2019 showed no ischemia and echocardiogram showed normal ejection fraction and mild mitral regurgitation. Since last seen she denies dyspnea, chest pain, palpitations, syncope or bleeding.  She recently lost her husband and is appropriately grieving.  Current Outpatient Medications  Medication Sig Dispense Refill  . Carboxymethylcellulose Sod PF (REFRESH PLUS) 0.5 % SOLN 1 drop 3 (three) times daily as needed.    . cholecalciferol (VITAMIN D) 1000 UNITS tablet Take 1,000 Units by mouth daily.     Marland Kitchen diltiazem (CARDIZEM) 30 MG tablet Take 1 tablet every 4 hours AS NEEDED for AFIB heart rate over 100 45 tablet 1  . esomeprazole (NEXIUM) 20 MG capsule Take 20 mg by mouth as needed.    . Famotidine (PEPCID PO) Take 1 tablet by mouth daily as needed.     . hydrochlorothiazide (HYDRODIURIL) 12.5 MG tablet Take 12.5 mg by mouth every other day.    Marland Kitchen LORazepam (ATIVAN) 1 MG tablet Take 1 mg by mouth as needed for anxiety.    . potassium chloride (KLOR-CON) 20 MEQ packet Take 20 mEq by mouth daily.    Alveda Reasons 20 MG TABS tablet Take 1 tablet by mouth daily.     No current facility-administered medications for this visit.     Past Medical History:  Diagnosis Date  . Allergy    occasionally   . Anxiety    closterphobia  . Arthritis    knees and thumbs   . Cataract    bilaterally and removed  . Colon polyps   . Eye pressure    elevated eye pressure because of thickened cornea but monitoring for glaucona  . GERD (gastroesophageal reflux disease)   . Hyperlipidemia   . Hypertension   . IBS (irritable bowel syndrome)   . Osteopenia   . Other fracture of right great toe, subsequent encounter for fracture with routine healing   .  Sciatica     Past Surgical History:  Procedure Laterality Date  . APPENDECTOMY    . COLONOSCOPY    . DILATION AND CURETTAGE OF UTERUS     x 6   . ENDOMETRIAL ABLATION    . EXPLORATORY LAPAROTOMY    . POLYPECTOMY    . TONSILLECTOMY      Social History   Socioeconomic History  . Marital status: Married    Spouse name: Not on file  . Number of children: Not on file  . Years of education: Not on file  . Highest education level: Not on file  Occupational History  . Not on file  Tobacco Use  . Smoking status: Never Smoker  . Smokeless tobacco: Never Used  Substance and Sexual Activity  . Alcohol use: No  . Drug use: No  . Sexual activity: Not on file  Other Topics Concern  . Not on file  Social History Narrative  . Not on file   Social Determinants of Health   Financial Resource Strain:   . Difficulty of Paying Living Expenses: Not on file  Food Insecurity:   . Worried About Charity fundraiser in the Last Year: Not on file  . Ran Out of Food in the Last Year: Not on file  Transportation Needs:   . Lack of Transportation (Medical): Not on  file  . Lack of Transportation (Non-Medical): Not on file  Physical Activity:   . Days of Exercise per Week: Not on file  . Minutes of Exercise per Session: Not on file  Stress:   . Feeling of Stress : Not on file  Social Connections:   . Frequency of Communication with Friends and Family: Not on file  . Frequency of Social Gatherings with Friends and Family: Not on file  . Attends Religious Services: Not on file  . Active Member of Clubs or Organizations: Not on file  . Attends Archivist Meetings: Not on file  . Marital Status: Not on file  Intimate Partner Violence:   . Fear of Current or Ex-Partner: Not on file  . Emotionally Abused: Not on file  . Physically Abused: Not on file  . Sexually Abused: Not on file    Family History  Problem Relation Age of Onset  . Dementia Mother   . Cancer - Lung Brother     . Dementia Brother   . Colon polyps Brother   . Colon cancer Neg Hx   . Rectal cancer Neg Hx   . Stomach cancer Neg Hx     ROS: no fevers or chills, productive cough, hemoptysis, dysphasia, odynophagia, melena, hematochezia, dysuria, hematuria, rash, seizure activity, orthopnea, PND, pedal edema, claudication. Remaining systems are negative.  Physical Exam: Well-developed well-nourished in no acute distress.  Skin is warm and dry.  HEENT is normal.  Neck is supple.  Chest is clear to auscultation with normal expansion.  Cardiovascular exam is regular rate and rhythm.  Abdominal exam nontender or distended. No masses palpated. Extremities show trace edema. neuro grossly intact  ECG-normal sinus rhythm at a rate of 71, no ST changes.  Personally reviewed  A/P  1 paroxysmal atrial fibrillation-patient has not had recurrences by history.  Continue Cardizem and Xarelto at present dose.  Can consider addition of antiarrhythmic or referral for ablation in the future if she has more frequent episodes.  Check hemoglobin and renal function.  2 hypertension-blood pressure elevated; however she follows this at home and it is typically controlled.  Continue present medical regimen and follow.  3 hyperlipidemia-Per primary care.  Kirk Ruths, MD

## 2019-08-20 LAB — BASIC METABOLIC PANEL
BUN/Creatinine Ratio: 26 (ref 12–28)
BUN: 21 mg/dL (ref 8–27)
CO2: 23 mmol/L (ref 20–29)
Calcium: 10.2 mg/dL (ref 8.7–10.3)
Chloride: 105 mmol/L (ref 96–106)
Creatinine, Ser: 0.81 mg/dL (ref 0.57–1.00)
GFR calc Af Amer: 81 mL/min/{1.73_m2} (ref >59)
GFR calc non Af Amer: 70 mL/min/{1.73_m2} (ref >59)
Glucose: 87 mg/dL (ref 65–99)
Potassium: 4.2 mmol/L (ref 3.5–5.2)
Sodium: 144 mmol/L (ref 134–144)

## 2019-08-20 LAB — CBC
Hematocrit: 43.8 % (ref 34.0–46.6)
Hemoglobin: 14.7 g/dL (ref 11.1–15.9)
MCH: 29.4 pg (ref 26.6–33.0)
MCHC: 33.6 g/dL (ref 31.5–35.7)
MCV: 88 fL (ref 79–97)
Platelets: 209 10*3/uL (ref 150–450)
RBC: 5 x10E6/uL (ref 3.77–5.28)
RDW: 13.2 % (ref 11.7–15.4)
WBC: 8.3 10*3/uL (ref 3.4–10.8)

## 2019-09-09 ENCOUNTER — Other Ambulatory Visit: Payer: Self-pay | Admitting: Neurosurgery

## 2019-09-09 ENCOUNTER — Other Ambulatory Visit (HOSPITAL_COMMUNITY): Payer: Self-pay | Admitting: Neurosurgery

## 2019-09-09 DIAGNOSIS — D329 Benign neoplasm of meninges, unspecified: Secondary | ICD-10-CM

## 2019-09-12 ENCOUNTER — Ambulatory Visit: Payer: Medicare Other | Attending: Internal Medicine

## 2019-09-12 DIAGNOSIS — Z23 Encounter for immunization: Secondary | ICD-10-CM | POA: Insufficient documentation

## 2019-09-12 NOTE — Progress Notes (Signed)
   Covid-19 Vaccination Clinic  Name:  Jodie-Lee Ehmke    MRN: [id number] DOB: 02-19-1941  09/12/2019  Ms. Manzano was observed post Covid-19 immunization for 30 minutes based on pre-vaccination screening without incidence. She was provided with Vaccine Information Sheet and instruction to access the V-Safe system.   Ms. Darty was instructed to call 911 with any severe reactions post vaccine: Marland Kitchen Difficulty breathing  . Swelling of your face and throat  . A fast heartbeat  . A bad rash all over your body  . Dizziness and weakness    Immunizations Administered    Name Date Dose VIS Date Route   Pfizer COVID-19 Vaccine 09/12/2019  2:21 PM 0.3 mL 08/13/2019 Intramuscular   Manufacturer: [employer], [employer]   Lot: [id number]   [place]: [id number]

## 2019-09-17 DIAGNOSIS — I872 Venous insufficiency (chronic) (peripheral): Secondary | ICD-10-CM | POA: Diagnosis not present

## 2019-09-17 DIAGNOSIS — D329 Benign neoplasm of meninges, unspecified: Secondary | ICD-10-CM | POA: Diagnosis not present

## 2019-09-17 DIAGNOSIS — I480 Paroxysmal atrial fibrillation: Secondary | ICD-10-CM | POA: Diagnosis not present

## 2019-09-17 DIAGNOSIS — K219 Gastro-esophageal reflux disease without esophagitis: Secondary | ICD-10-CM | POA: Diagnosis not present

## 2019-09-17 DIAGNOSIS — I10 Essential (primary) hypertension: Secondary | ICD-10-CM | POA: Diagnosis not present

## 2019-09-17 DIAGNOSIS — F4321 Adjustment disorder with depressed mood: Secondary | ICD-10-CM | POA: Diagnosis not present

## 2019-09-20 ENCOUNTER — Ambulatory Visit (HOSPITAL_COMMUNITY)
Admission: RE | Admit: 2019-09-20 | Discharge: 2019-09-20 | Disposition: A | Discharge status: Home / Self Care | Payer: PPO | Source: Ambulatory Visit | Attending: Neurosurgery | Admitting: Neurosurgery

## 2019-09-20 ENCOUNTER — Other Ambulatory Visit: Payer: Self-pay

## 2019-09-20 DIAGNOSIS — D329 Benign neoplasm of meninges, unspecified: Secondary | ICD-10-CM | POA: Insufficient documentation

## 2019-09-20 DIAGNOSIS — I10 Essential (primary) hypertension: Secondary | ICD-10-CM | POA: Diagnosis not present

## 2019-09-20 MED ORDER — GADOBUTROL 1 MMOL/ML IV SOLN
10.0000 mL | Freq: Once | INTRAVENOUS | Status: AC | PRN
  Administered 2019-09-20: 10 mL via INTRAVENOUS

## 2019-09-23 DIAGNOSIS — D329 Benign neoplasm of meninges, unspecified: Secondary | ICD-10-CM | POA: Diagnosis not present

## 2019-10-01 DIAGNOSIS — M179 Osteoarthritis of knee, unspecified: Secondary | ICD-10-CM | POA: Diagnosis not present

## 2019-10-01 DIAGNOSIS — I480 Paroxysmal atrial fibrillation: Secondary | ICD-10-CM | POA: Diagnosis not present

## 2019-10-01 DIAGNOSIS — I10 Essential (primary) hypertension: Secondary | ICD-10-CM | POA: Diagnosis not present

## 2019-10-01 DIAGNOSIS — F4321 Adjustment disorder with depressed mood: Secondary | ICD-10-CM | POA: Diagnosis not present

## 2019-10-01 DIAGNOSIS — M8580 Other specified disorders of bone density and structure, unspecified site: Secondary | ICD-10-CM | POA: Diagnosis not present

## 2019-10-01 DIAGNOSIS — E7800 Pure hypercholesterolemia, unspecified: Secondary | ICD-10-CM | POA: Diagnosis not present

## 2019-10-02 ENCOUNTER — Ambulatory Visit: Payer: PPO | Attending: Internal Medicine

## 2019-10-02 DIAGNOSIS — Z23 Encounter for immunization: Secondary | ICD-10-CM | POA: Insufficient documentation

## 2019-10-02 NOTE — Progress Notes (Signed)
   Covid-19 Vaccination Clinic  Name:  Amber Sims    MRN: [id number] DOB: 08-30-1941  10/02/2019  [name] was observed post Covid-19 immunization for 15 minutes without incidence. She was provided with Vaccine Information Sheet and instruction to access the V-Safe system.   [name] was instructed to call 911 with any severe reactions post vaccine: Marland Kitchen Difficulty breathing  . Swelling of your face and throat  . A fast heartbeat  . A bad rash all over your body  . Dizziness and weakness    Immunizations Administered    Name Date Dose VIS Date Route   Pfizer COVID-19 Vaccine 10/02/2019  3:04 PM 0.3 mL 08/13/2019 Intramuscular   Manufacturer: [place]   Lot: [id number]   [place]: [id number]

## 2019-10-03 ENCOUNTER — Ambulatory Visit: Payer: PPO

## 2019-10-11 DIAGNOSIS — Z961 Presence of intraocular lens: Secondary | ICD-10-CM | POA: Diagnosis not present

## 2019-10-11 DIAGNOSIS — H40053 Ocular hypertension, bilateral: Secondary | ICD-10-CM | POA: Diagnosis not present

## 2019-10-11 DIAGNOSIS — H26493 Other secondary cataract, bilateral: Secondary | ICD-10-CM | POA: Diagnosis not present

## 2019-10-15 DIAGNOSIS — F4321 Adjustment disorder with depressed mood: Secondary | ICD-10-CM | POA: Diagnosis not present

## 2019-11-23 DIAGNOSIS — M5441 Lumbago with sciatica, right side: Secondary | ICD-10-CM | POA: Diagnosis not present

## 2019-11-26 ENCOUNTER — Other Ambulatory Visit (HOSPITAL_COMMUNITY): Payer: Self-pay | Admitting: Nurse Practitioner

## 2019-11-26 DIAGNOSIS — M179 Osteoarthritis of knee, unspecified: Secondary | ICD-10-CM | POA: Diagnosis not present

## 2019-11-26 DIAGNOSIS — E7800 Pure hypercholesterolemia, unspecified: Secondary | ICD-10-CM | POA: Diagnosis not present

## 2019-11-26 DIAGNOSIS — I10 Essential (primary) hypertension: Secondary | ICD-10-CM | POA: Diagnosis not present

## 2019-11-26 DIAGNOSIS — F4321 Adjustment disorder with depressed mood: Secondary | ICD-10-CM | POA: Diagnosis not present

## 2019-11-26 DIAGNOSIS — I480 Paroxysmal atrial fibrillation: Secondary | ICD-10-CM | POA: Diagnosis not present

## 2019-11-26 DIAGNOSIS — M8580 Other specified disorders of bone density and structure, unspecified site: Secondary | ICD-10-CM | POA: Diagnosis not present

## 2019-12-22 DIAGNOSIS — M79674 Pain in right toe(s): Secondary | ICD-10-CM | POA: Diagnosis not present

## 2019-12-31 DIAGNOSIS — M79674 Pain in right toe(s): Secondary | ICD-10-CM | POA: Diagnosis not present

## 2020-01-11 ENCOUNTER — Telehealth: Payer: Self-pay | Admitting: Cardiology

## 2020-01-11 NOTE — Telephone Encounter (Signed)
Pt c/o BP issue: STAT if pt c/o blurred vision, one-sided weakness or slurred speech  1. What are your last 5 BP readings?  01/09/20:   150/90-80 (estimate)  2. Are you having any other symptoms (ex. Dizziness, headache, blurred vision, passed out)? No  3. What is your BP issue? Patient states she is concerned due to her BP being elevated on 01/09/20. However, she states her BP has returned to normal.

## 2020-01-11 NOTE — Telephone Encounter (Signed)
Returned the call to the patient. She stated that she feels like on Sunday she may have been in afib. Her Kardia read abnormal. She was asymptomatic.   On Sunday her blood pressure was 150/93 and heart rate was 103.  Monday it was 137/77 and heart rate was 70, normal rhythm Today it was 138/82 and heart rate was 69 and normal.   She feels like it may have been from stress with her lack of sleep and recent move. She has been advised to monitor her rhythm and call back if she feels like she is in afib.

## 2020-01-14 ENCOUNTER — Encounter: Payer: Self-pay | Admitting: *Deleted

## 2020-01-14 DIAGNOSIS — M79671 Pain in right foot: Secondary | ICD-10-CM | POA: Diagnosis not present

## 2020-01-17 ENCOUNTER — Encounter: Payer: Self-pay | Admitting: *Deleted

## 2020-01-28 DIAGNOSIS — M179 Osteoarthritis of knee, unspecified: Secondary | ICD-10-CM | POA: Diagnosis not present

## 2020-01-28 DIAGNOSIS — I10 Essential (primary) hypertension: Secondary | ICD-10-CM | POA: Diagnosis not present

## 2020-01-28 DIAGNOSIS — E7800 Pure hypercholesterolemia, unspecified: Secondary | ICD-10-CM | POA: Diagnosis not present

## 2020-01-28 DIAGNOSIS — I480 Paroxysmal atrial fibrillation: Secondary | ICD-10-CM | POA: Diagnosis not present

## 2020-01-28 DIAGNOSIS — M8580 Other specified disorders of bone density and structure, unspecified site: Secondary | ICD-10-CM | POA: Diagnosis not present

## 2020-01-28 DIAGNOSIS — F4321 Adjustment disorder with depressed mood: Secondary | ICD-10-CM | POA: Diagnosis not present

## 2020-03-02 DIAGNOSIS — H903 Sensorineural hearing loss, bilateral: Secondary | ICD-10-CM | POA: Diagnosis not present

## 2020-03-02 DIAGNOSIS — L309 Dermatitis, unspecified: Secondary | ICD-10-CM | POA: Diagnosis not present

## 2020-03-02 DIAGNOSIS — D329 Benign neoplasm of meninges, unspecified: Secondary | ICD-10-CM | POA: Diagnosis not present

## 2020-03-02 DIAGNOSIS — Z888 Allergy status to other drugs, medicaments and biological substances status: Secondary | ICD-10-CM | POA: Diagnosis not present

## 2020-03-02 DIAGNOSIS — H6123 Impacted cerumen, bilateral: Secondary | ICD-10-CM | POA: Diagnosis not present

## 2020-03-31 NOTE — Progress Notes (Signed)
HPI: FUatrial fibrillation. Patient seen in the emergency room December 2018 with atrial fibrillation. She converted to sinus rhythm spontaneously. Patient treated with Cardizem and Xarelto. TSH was normal. Seen by Dr. Einar Gip and nuclear study January 2019 showed no ischemia and echocardiogram showed normal ejection fraction and mild mitral regurgitation. Since last seen  there is no dyspnea, chest pain or syncope.  She has occasional palpitations.  No bleeding.  Current Outpatient Medications  Medication Sig Dispense Refill   Carboxymethylcellulose Sod PF (REFRESH PLUS) 0.5 % SOLN 1 drop 3 (three) times daily as needed.     cholecalciferol (VITAMIN D) 1000 UNITS tablet Take 1,000 Units by mouth daily.      diltiazem (CARDIZEM) 30 MG tablet TAKE 1 TABELT EVERY 4 HOURS AS NEEDED FOR AFIB HEART RATE OVER 100. 45 tablet 2   esomeprazole (NEXIUM) 40 MG capsule Take by mouth.     Famotidine (PEPCID PO) Take 1 tablet by mouth daily as needed.      hydrochlorothiazide (HYDRODIURIL) 12.5 MG tablet Take 12.5 mg by mouth every other day.     LORazepam (ATIVAN) 0.5 MG tablet Take by mouth.     potassium chloride (KLOR-CON) 20 MEQ packet Take 20 mEq by mouth daily.     sertraline (ZOLOFT) 50 MG tablet Take 50 mg by mouth daily.     XARELTO 20 MG TABS tablet Take 1 tablet by mouth daily.     No current facility-administered medications for this visit.     Past Medical History:  Diagnosis Date   Allergy    occasionally    Anxiety    closterphobia   Arthritis    knees and thumbs    Cataract    bilaterally and removed   Colon polyps    Eye pressure    elevated eye pressure because of thickened cornea but monitoring for glaucona   GERD (gastroesophageal reflux disease)    Hyperlipidemia    Hypertension    IBS (irritable bowel syndrome)    Osteopenia    Other fracture of right great toe, subsequent encounter for fracture with routine healing    Sciatica      Past Surgical History:  Procedure Laterality Date   APPENDECTOMY     COLONOSCOPY     DILATION AND CURETTAGE OF UTERUS     x 6    ENDOMETRIAL ABLATION     EXPLORATORY LAPAROTOMY     POLYPECTOMY     TONSILLECTOMY      Social History   Socioeconomic History   Marital status: Widowed    Spouse name: Not on file   Number of children: Not on file   Years of education: Not on file   Highest education level: Not on file  Occupational History   Not on file  Tobacco Use   Smoking status: Never Smoker   Smokeless tobacco: Never Used  Substance and Sexual Activity   Alcohol use: No   Drug use: No   Sexual activity: Not on file  Other Topics Concern   Not on file  Social History Narrative   Not on file   Social Determinants of Health   Financial Resource Strain:    Difficulty of Paying Living Expenses:   Food Insecurity:    Worried About [place] in the Last Year:    [place] in the Last Year:   Transportation Needs:    Film/video editor (Medical):    Lack of Transportation (  Non-Medical):   Physical Activity:    Days of Exercise per Week:    Minutes of Exercise per Session:   Stress:    Feeling of Stress :   Social Connections:    Frequency of Communication with Friends and Family:    Frequency of Social Gatherings with Friends and Family:    Attends Religious Services:    Active Member of Clubs or Organizations:    Attends Music therapist:    Marital Status:   Intimate Partner Violence:    Fear of Current or Ex-Partner:    Emotionally Abused:    Physically Abused:    Sexually Abused:     Family History  Problem Relation Age of Onset   Dementia Mother    Cancer - Lung Brother    Dementia Brother    Colon polyps Brother    Colon cancer Neg Hx    Rectal cancer Neg Hx    Stomach cancer Neg Hx     ROS: no fevers or chills, productive cough, hemoptysis, dysphasia,  odynophagia, melena, hematochezia, dysuria, hematuria, rash, seizure activity, orthopnea, PND, pedal edema, claudication. Remaining systems are negative.  Physical Exam: Well-developed well-nourished in no acute distress.  Skin is warm and dry.  HEENT is normal.  Neck is supple.  Chest is clear to auscultation with normal expansion.  Cardiovascular exam is regular rate and rhythm.  Abdominal exam nontender or distended. No masses palpated. Extremities show no edema. neuro grossly intact  A/P  1 history of paroxysmal atrial fibrillation-patient remains in sinus rhythm.  Continue Cardizem as needed and Xarelto.  We will consider referral for ablation versus antiarrhythmic therapy in the future if she has more frequent episodes.  Check hemoglobin and renal function.  2 hypertension-patient's blood pressure is controlled.  Continue present medications and follow.  3 hyperlipidemia-managed by primary care.  4 palpitations-she has occasional palpitations.  She does have a cardia device.  I reviewed her strips today and she has sinus rhythm with PACs but no atrial fibrillation.  Continue present management.  Kirk Ruths, MD

## 2020-04-06 ENCOUNTER — Other Ambulatory Visit: Payer: Self-pay

## 2020-04-06 ENCOUNTER — Ambulatory Visit: Payer: PPO | Admitting: Cardiology

## 2020-04-06 ENCOUNTER — Encounter: Payer: Self-pay | Admitting: Cardiology

## 2020-04-06 VITALS — BP 138/72 | HR 95 | Wt 207.0 lb

## 2020-04-06 DIAGNOSIS — I10 Essential (primary) hypertension: Secondary | ICD-10-CM | POA: Diagnosis not present

## 2020-04-06 DIAGNOSIS — Z79899 Other long term (current) drug therapy: Secondary | ICD-10-CM | POA: Diagnosis not present

## 2020-04-06 DIAGNOSIS — I480 Paroxysmal atrial fibrillation: Secondary | ICD-10-CM | POA: Diagnosis not present

## 2020-04-06 DIAGNOSIS — E7800 Pure hypercholesterolemia, unspecified: Secondary | ICD-10-CM | POA: Diagnosis not present

## 2020-04-06 LAB — BASIC METABOLIC PANEL
BUN/Creatinine Ratio: 25 (ref 12–28)
BUN: 20 mg/dL (ref 8–27)
CO2: 23 mmol/L (ref 20–29)
Calcium: 9.8 mg/dL (ref 8.7–10.3)
Chloride: 102 mmol/L (ref 96–106)
Creatinine, Ser: 0.81 mg/dL (ref 0.57–1.00)
GFR calc Af Amer: 80 mL/min/{1.73_m2} (ref >59)
GFR calc non Af Amer: 70 mL/min/{1.73_m2} (ref >59)
Glucose: 90 mg/dL (ref 65–99)
Potassium: 4.3 mmol/L (ref 3.5–5.2)
Sodium: 140 mmol/L (ref 134–144)

## 2020-04-06 LAB — CBC
Hematocrit: 43.3 % (ref 34.0–46.6)
Hemoglobin: 14.2 g/dL (ref 11.1–15.9)
MCH: 28.7 pg (ref 26.6–33.0)
MCHC: 32.8 g/dL (ref 31.5–35.7)
MCV: 88 fL (ref 79–97)
Platelets: 170 10*3/uL (ref 150–450)
RBC: 4.95 x10E6/uL (ref 3.77–5.28)
RDW: 13.5 % (ref 11.7–15.4)
WBC: 6.6 10*3/uL (ref 3.4–10.8)

## 2020-04-06 NOTE — Patient Instructions (Signed)
Medication Instructions:  NO CHANGE *If you need a refill on your cardiac medications before your next appointment, please call your pharmacy*   Lab Work:  Your physician recommends that you HAVE LAB WORK TODAY If you have labs (blood work) drawn today and your tests are completely normal, you will receive your results only by: Marland Kitchen MyChart Message (if you have MyChart) OR . A paper copy in the mail If you have any lab test that is abnormal or we need to change your treatment, we will call you to review the results.   Follow-Up: At [facility], you and your health needs are our priority.  As part of our continuing mission to provide you with exceptional heart care, we have created designated Provider Care Teams.  These Care Teams include your primary Cardiologist (physician) and Advanced Practice Providers (APPs -  Physician Assistants and Nurse Practitioners) who all work together to provide you with the care you need, when you need it.  We recommend signing up for the patient portal called "MyChart".  Sign up information is provided on this After Visit Summary.  MyChart is used to connect with patients for Virtual Visits (Telemedicine).  Patients are able to view lab/test results, encounter notes, upcoming appointments, etc.  Non-urgent messages can be sent to your provider as well.   To learn more about what you can do with MyChart, go to [email or web address].    Your next appointment:   12 month(s)  The format for your next appointment:   In Person  Provider:   You may see Kirk Ruths MD or one of the following Advanced Practice Providers on your designated Care Team:    Kerin Ransom, PA-C  [place], [place]  Coletta Memos, [place]

## 2020-04-11 DIAGNOSIS — Z1231 Encounter for screening mammogram for malignant neoplasm of breast: Secondary | ICD-10-CM | POA: Diagnosis not present

## 2020-04-27 DIAGNOSIS — H524 Presbyopia: Secondary | ICD-10-CM | POA: Diagnosis not present

## 2020-04-27 DIAGNOSIS — H43813 Vitreous degeneration, bilateral: Secondary | ICD-10-CM | POA: Diagnosis not present

## 2020-04-27 DIAGNOSIS — H40053 Ocular hypertension, bilateral: Secondary | ICD-10-CM | POA: Diagnosis not present

## 2020-04-27 DIAGNOSIS — H26493 Other secondary cataract, bilateral: Secondary | ICD-10-CM | POA: Diagnosis not present

## 2020-06-01 DIAGNOSIS — Z23 Encounter for immunization: Secondary | ICD-10-CM | POA: Diagnosis not present

## 2020-06-06 DIAGNOSIS — R208 Other disturbances of skin sensation: Secondary | ICD-10-CM | POA: Diagnosis not present

## 2020-06-06 DIAGNOSIS — N762 Acute vulvitis: Secondary | ICD-10-CM | POA: Diagnosis not present

## 2020-06-06 DIAGNOSIS — Z01419 Encounter for gynecological examination (general) (routine) without abnormal findings: Secondary | ICD-10-CM | POA: Diagnosis not present

## 2020-06-06 DIAGNOSIS — Z6839 Body mass index (BMI) 39.0-39.9, adult: Secondary | ICD-10-CM | POA: Diagnosis not present

## 2020-06-09 DIAGNOSIS — E7800 Pure hypercholesterolemia, unspecified: Secondary | ICD-10-CM | POA: Diagnosis not present

## 2020-06-09 DIAGNOSIS — E210 Primary hyperparathyroidism: Secondary | ICD-10-CM | POA: Diagnosis not present

## 2020-06-09 DIAGNOSIS — K219 Gastro-esophageal reflux disease without esophagitis: Secondary | ICD-10-CM | POA: Diagnosis not present

## 2020-06-09 DIAGNOSIS — I480 Paroxysmal atrial fibrillation: Secondary | ICD-10-CM | POA: Diagnosis not present

## 2020-06-09 DIAGNOSIS — M4696 Unspecified inflammatory spondylopathy, lumbar region: Secondary | ICD-10-CM | POA: Diagnosis not present

## 2020-06-09 DIAGNOSIS — Z1389 Encounter for screening for other disorder: Secondary | ICD-10-CM | POA: Diagnosis not present

## 2020-06-09 DIAGNOSIS — Z6839 Body mass index (BMI) 39.0-39.9, adult: Secondary | ICD-10-CM | POA: Diagnosis not present

## 2020-06-09 DIAGNOSIS — I10 Essential (primary) hypertension: Secondary | ICD-10-CM | POA: Diagnosis not present

## 2020-06-09 DIAGNOSIS — Z0000 Encounter for general adult medical examination without abnormal findings: Secondary | ICD-10-CM | POA: Diagnosis not present

## 2020-06-09 DIAGNOSIS — F4321 Adjustment disorder with depressed mood: Secondary | ICD-10-CM | POA: Diagnosis not present

## 2020-06-14 DIAGNOSIS — L918 Other hypertrophic disorders of the skin: Secondary | ICD-10-CM | POA: Diagnosis not present

## 2020-06-14 DIAGNOSIS — D2262 Melanocytic nevi of left upper limb, including shoulder: Secondary | ICD-10-CM | POA: Diagnosis not present

## 2020-06-14 DIAGNOSIS — D1801 Hemangioma of skin and subcutaneous tissue: Secondary | ICD-10-CM | POA: Diagnosis not present

## 2020-06-14 DIAGNOSIS — L570 Actinic keratosis: Secondary | ICD-10-CM | POA: Diagnosis not present

## 2020-06-14 DIAGNOSIS — D2261 Melanocytic nevi of right upper limb, including shoulder: Secondary | ICD-10-CM | POA: Diagnosis not present

## 2020-06-14 DIAGNOSIS — D485 Neoplasm of uncertain behavior of skin: Secondary | ICD-10-CM | POA: Diagnosis not present

## 2020-06-14 DIAGNOSIS — D2239 Melanocytic nevi of other parts of face: Secondary | ICD-10-CM | POA: Diagnosis not present

## 2020-06-14 DIAGNOSIS — D492 Neoplasm of unspecified behavior of bone, soft tissue, and skin: Secondary | ICD-10-CM | POA: Diagnosis not present

## 2020-06-14 DIAGNOSIS — D2272 Melanocytic nevi of left lower limb, including hip: Secondary | ICD-10-CM | POA: Diagnosis not present

## 2020-06-14 DIAGNOSIS — L821 Other seborrheic keratosis: Secondary | ICD-10-CM | POA: Diagnosis not present

## 2020-06-26 DIAGNOSIS — I10 Essential (primary) hypertension: Secondary | ICD-10-CM | POA: Diagnosis not present

## 2020-06-26 DIAGNOSIS — I480 Paroxysmal atrial fibrillation: Secondary | ICD-10-CM | POA: Diagnosis not present

## 2020-06-26 DIAGNOSIS — M179 Osteoarthritis of knee, unspecified: Secondary | ICD-10-CM | POA: Diagnosis not present

## 2020-06-26 DIAGNOSIS — F4321 Adjustment disorder with depressed mood: Secondary | ICD-10-CM | POA: Diagnosis not present

## 2020-06-26 DIAGNOSIS — K219 Gastro-esophageal reflux disease without esophagitis: Secondary | ICD-10-CM | POA: Diagnosis not present

## 2020-06-26 DIAGNOSIS — M8580 Other specified disorders of bone density and structure, unspecified site: Secondary | ICD-10-CM | POA: Diagnosis not present

## 2020-06-26 DIAGNOSIS — E7800 Pure hypercholesterolemia, unspecified: Secondary | ICD-10-CM | POA: Diagnosis not present

## 2020-06-29 DIAGNOSIS — H26491 Other secondary cataract, right eye: Secondary | ICD-10-CM | POA: Diagnosis not present

## 2020-07-01 ENCOUNTER — Other Ambulatory Visit: Payer: Self-pay

## 2020-07-01 ENCOUNTER — Ambulatory Visit: Payer: PPO | Attending: Internal Medicine

## 2020-07-01 DIAGNOSIS — Z23 Encounter for immunization: Secondary | ICD-10-CM

## 2020-07-01 NOTE — Progress Notes (Signed)
   Covid-19 Vaccination Clinic  Name:  Amber Sims    MRN: [id number] DOB: 01-13-1941  07/01/2020  Amber Sims was observed post Covid-19 immunization for 15 minutes without incident. She was provided with Vaccine Information Sheet and instruction to access the V-Safe system.   Amber Sims was instructed to call 911 with any severe reactions post vaccine: Marland Kitchen Difficulty breathing  . Swelling of face and throat  . A fast heartbeat  . A bad rash all over body  . Dizziness and weakness

## 2020-07-06 DIAGNOSIS — H26492 Other secondary cataract, left eye: Secondary | ICD-10-CM | POA: Diagnosis not present

## 2020-08-11 DIAGNOSIS — M79605 Pain in left leg: Secondary | ICD-10-CM | POA: Diagnosis not present

## 2020-08-11 DIAGNOSIS — Z86718 Personal history of other venous thrombosis and embolism: Secondary | ICD-10-CM | POA: Diagnosis not present

## 2020-08-11 DIAGNOSIS — I10 Essential (primary) hypertension: Secondary | ICD-10-CM | POA: Diagnosis not present

## 2020-08-11 DIAGNOSIS — M7122 Synovial cyst of popliteal space [Baker], left knee: Secondary | ICD-10-CM | POA: Diagnosis not present

## 2020-08-11 DIAGNOSIS — M1712 Unilateral primary osteoarthritis, left knee: Secondary | ICD-10-CM | POA: Diagnosis not present

## 2020-09-05 DIAGNOSIS — K219 Gastro-esophageal reflux disease without esophagitis: Secondary | ICD-10-CM | POA: Diagnosis not present

## 2020-09-05 DIAGNOSIS — F4321 Adjustment disorder with depressed mood: Secondary | ICD-10-CM | POA: Diagnosis not present

## 2020-09-05 DIAGNOSIS — I480 Paroxysmal atrial fibrillation: Secondary | ICD-10-CM | POA: Diagnosis not present

## 2020-09-05 DIAGNOSIS — M8580 Other specified disorders of bone density and structure, unspecified site: Secondary | ICD-10-CM | POA: Diagnosis not present

## 2020-09-05 DIAGNOSIS — I10 Essential (primary) hypertension: Secondary | ICD-10-CM | POA: Diagnosis not present

## 2020-09-05 DIAGNOSIS — E7800 Pure hypercholesterolemia, unspecified: Secondary | ICD-10-CM | POA: Diagnosis not present

## 2020-09-05 DIAGNOSIS — M179 Osteoarthritis of knee, unspecified: Secondary | ICD-10-CM | POA: Diagnosis not present

## 2020-09-19 ENCOUNTER — Other Ambulatory Visit: Payer: Self-pay | Admitting: Neurological Surgery

## 2020-09-19 ENCOUNTER — Other Ambulatory Visit (HOSPITAL_COMMUNITY): Payer: Self-pay | Admitting: Neurological Surgery

## 2020-09-19 DIAGNOSIS — D329 Benign neoplasm of meninges, unspecified: Secondary | ICD-10-CM

## 2020-10-12 ENCOUNTER — Other Ambulatory Visit: Payer: Self-pay

## 2020-10-12 ENCOUNTER — Ambulatory Visit (HOSPITAL_COMMUNITY)
Admission: RE | Admit: 2020-10-12 | Discharge: 2020-10-12 | Disposition: A | Discharge status: Home / Self Care | Payer: PPO | Source: Ambulatory Visit | Attending: Neurological Surgery | Admitting: Neurological Surgery

## 2020-10-12 DIAGNOSIS — H748X3 Other specified disorders of middle ear and mastoid, bilateral: Secondary | ICD-10-CM | POA: Diagnosis not present

## 2020-10-12 DIAGNOSIS — M2548 Effusion, other site: Secondary | ICD-10-CM | POA: Diagnosis not present

## 2020-10-12 DIAGNOSIS — D329 Benign neoplasm of meninges, unspecified: Secondary | ICD-10-CM | POA: Insufficient documentation

## 2020-10-12 DIAGNOSIS — G9389 Other specified disorders of brain: Secondary | ICD-10-CM | POA: Diagnosis not present

## 2020-10-12 MED ORDER — GADOBUTROL 1 MMOL/ML IV SOLN
9.5000 mL | Freq: Once | INTRAVENOUS | Status: AC | PRN
  Administered 2020-10-12: 9.5 mL via INTRAVENOUS

## 2020-10-13 DIAGNOSIS — D329 Benign neoplasm of meninges, unspecified: Secondary | ICD-10-CM | POA: Diagnosis not present

## 2020-11-28 DIAGNOSIS — I480 Paroxysmal atrial fibrillation: Secondary | ICD-10-CM | POA: Diagnosis not present

## 2020-11-28 DIAGNOSIS — M179 Osteoarthritis of knee, unspecified: Secondary | ICD-10-CM | POA: Diagnosis not present

## 2020-11-28 DIAGNOSIS — I10 Essential (primary) hypertension: Secondary | ICD-10-CM | POA: Diagnosis not present

## 2020-11-28 DIAGNOSIS — M8580 Other specified disorders of bone density and structure, unspecified site: Secondary | ICD-10-CM | POA: Diagnosis not present

## 2020-11-28 DIAGNOSIS — F4321 Adjustment disorder with depressed mood: Secondary | ICD-10-CM | POA: Diagnosis not present

## 2020-11-28 DIAGNOSIS — E7800 Pure hypercholesterolemia, unspecified: Secondary | ICD-10-CM | POA: Diagnosis not present

## 2020-11-28 DIAGNOSIS — K219 Gastro-esophageal reflux disease without esophagitis: Secondary | ICD-10-CM | POA: Diagnosis not present

## 2020-12-13 DIAGNOSIS — K219 Gastro-esophageal reflux disease without esophagitis: Secondary | ICD-10-CM | POA: Diagnosis not present

## 2020-12-13 DIAGNOSIS — I480 Paroxysmal atrial fibrillation: Secondary | ICD-10-CM | POA: Diagnosis not present

## 2020-12-13 DIAGNOSIS — D329 Benign neoplasm of meninges, unspecified: Secondary | ICD-10-CM | POA: Diagnosis not present

## 2020-12-13 DIAGNOSIS — M170 Bilateral primary osteoarthritis of knee: Secondary | ICD-10-CM | POA: Diagnosis not present

## 2020-12-13 DIAGNOSIS — Z6841 Body Mass Index (BMI) 40.0 and over, adult: Secondary | ICD-10-CM | POA: Diagnosis not present

## 2020-12-13 DIAGNOSIS — F325 Major depressive disorder, single episode, in full remission: Secondary | ICD-10-CM | POA: Diagnosis not present

## 2020-12-13 DIAGNOSIS — M4696 Unspecified inflammatory spondylopathy, lumbar region: Secondary | ICD-10-CM | POA: Diagnosis not present

## 2020-12-13 DIAGNOSIS — I10 Essential (primary) hypertension: Secondary | ICD-10-CM | POA: Diagnosis not present

## 2020-12-13 DIAGNOSIS — E7800 Pure hypercholesterolemia, unspecified: Secondary | ICD-10-CM | POA: Diagnosis not present

## 2020-12-27 DIAGNOSIS — M1712 Unilateral primary osteoarthritis, left knee: Secondary | ICD-10-CM | POA: Diagnosis not present

## 2020-12-27 DIAGNOSIS — M170 Bilateral primary osteoarthritis of knee: Secondary | ICD-10-CM | POA: Diagnosis not present

## 2021-01-30 DIAGNOSIS — E7800 Pure hypercholesterolemia, unspecified: Secondary | ICD-10-CM | POA: Diagnosis not present

## 2021-01-30 DIAGNOSIS — M8580 Other specified disorders of bone density and structure, unspecified site: Secondary | ICD-10-CM | POA: Diagnosis not present

## 2021-01-30 DIAGNOSIS — M170 Bilateral primary osteoarthritis of knee: Secondary | ICD-10-CM | POA: Diagnosis not present

## 2021-01-30 DIAGNOSIS — M179 Osteoarthritis of knee, unspecified: Secondary | ICD-10-CM | POA: Diagnosis not present

## 2021-01-30 DIAGNOSIS — I10 Essential (primary) hypertension: Secondary | ICD-10-CM | POA: Diagnosis not present

## 2021-01-30 DIAGNOSIS — I480 Paroxysmal atrial fibrillation: Secondary | ICD-10-CM | POA: Diagnosis not present

## 2021-01-30 DIAGNOSIS — K219 Gastro-esophageal reflux disease without esophagitis: Secondary | ICD-10-CM | POA: Diagnosis not present

## 2021-01-30 DIAGNOSIS — F325 Major depressive disorder, single episode, in full remission: Secondary | ICD-10-CM | POA: Diagnosis not present

## 2021-01-30 DIAGNOSIS — F4321 Adjustment disorder with depressed mood: Secondary | ICD-10-CM | POA: Diagnosis not present

## 2021-02-05 DIAGNOSIS — M5416 Radiculopathy, lumbar region: Secondary | ICD-10-CM | POA: Diagnosis not present

## 2021-02-06 DIAGNOSIS — M1712 Unilateral primary osteoarthritis, left knee: Secondary | ICD-10-CM | POA: Diagnosis not present

## 2021-02-06 DIAGNOSIS — M179 Osteoarthritis of knee, unspecified: Secondary | ICD-10-CM | POA: Diagnosis not present

## 2021-02-12 DIAGNOSIS — H903 Sensorineural hearing loss, bilateral: Secondary | ICD-10-CM | POA: Diagnosis not present

## 2021-02-13 DIAGNOSIS — M1712 Unilateral primary osteoarthritis, left knee: Secondary | ICD-10-CM | POA: Diagnosis not present

## 2021-02-13 DIAGNOSIS — M179 Osteoarthritis of knee, unspecified: Secondary | ICD-10-CM | POA: Diagnosis not present

## 2021-02-20 DIAGNOSIS — M1712 Unilateral primary osteoarthritis, left knee: Secondary | ICD-10-CM | POA: Diagnosis not present

## 2021-02-23 DIAGNOSIS — M25562 Pain in left knee: Secondary | ICD-10-CM | POA: Diagnosis not present

## 2021-02-26 DIAGNOSIS — H903 Sensorineural hearing loss, bilateral: Secondary | ICD-10-CM | POA: Diagnosis not present

## 2021-03-07 DIAGNOSIS — M25562 Pain in left knee: Secondary | ICD-10-CM | POA: Diagnosis not present

## 2021-03-16 DIAGNOSIS — M25562 Pain in left knee: Secondary | ICD-10-CM | POA: Diagnosis not present

## 2021-03-21 DIAGNOSIS — M25562 Pain in left knee: Secondary | ICD-10-CM | POA: Diagnosis not present

## 2021-03-28 DIAGNOSIS — M25562 Pain in left knee: Secondary | ICD-10-CM | POA: Diagnosis not present

## 2021-04-05 DIAGNOSIS — M170 Bilateral primary osteoarthritis of knee: Secondary | ICD-10-CM | POA: Diagnosis not present

## 2021-04-14 NOTE — Progress Notes (Deleted)
HPI: FU atrial fibrillation.  Patient seen in the emergency room December 2018 with atrial fibrillation.  She converted to sinus rhythm spontaneously.  Patient treated with Cardizem and Xarelto.  TSH was normal.  Seen by Dr. Einar Gip and nuclear study January 2019 showed no ischemia and echocardiogram showed normal ejection fraction and mild mitral regurgitation. Since last seen    Current Outpatient Medications  Medication Sig Dispense Refill   Carboxymethylcellulose Sod PF (REFRESH PLUS) 0.5 % SOLN 1 drop 3 (three) times daily as needed.     cholecalciferol (VITAMIN D) 1000 UNITS tablet Take 1,000 Units by mouth daily.      diltiazem (CARDIZEM) 30 MG tablet TAKE 1 TABELT EVERY 4 HOURS AS NEEDED FOR AFIB HEART RATE OVER 100. 45 tablet 2   esomeprazole (NEXIUM) 40 MG capsule Take by mouth.     Famotidine (PEPCID PO) Take 1 tablet by mouth daily as needed.      hydrochlorothiazide (HYDRODIURIL) 12.5 MG tablet Take 12.5 mg by mouth every other day.     LORazepam (ATIVAN) 0.5 MG tablet Take by mouth.     potassium chloride (KLOR-CON) 20 MEQ packet Take 20 mEq by mouth daily.     sertraline (ZOLOFT) 50 MG tablet Take 50 mg by mouth daily.     XARELTO 20 MG TABS tablet Take 1 tablet by mouth daily.     No current facility-administered medications for this visit.     Past Medical History:  Diagnosis Date   Allergy    occasionally    Anxiety    closterphobia   Arthritis    knees and thumbs    Cataract    bilaterally and removed   Colon polyps    Eye pressure    elevated eye pressure because of thickened cornea but monitoring for glaucona   GERD (gastroesophageal reflux disease)    Hyperlipidemia    Hypertension    IBS (irritable bowel syndrome)    Osteopenia    Other fracture of right great toe, subsequent encounter for fracture with routine healing    Sciatica     Past Surgical History:  Procedure Laterality Date   APPENDECTOMY     COLONOSCOPY     DILATION AND CURETTAGE  OF UTERUS     x 6    ENDOMETRIAL ABLATION     EXPLORATORY LAPAROTOMY     POLYPECTOMY     TONSILLECTOMY      Social History   Socioeconomic History   Marital status: Widowed    Spouse name: Not on file   Number of children: Not on file   Years of education: Not on file   Highest education level: Not on file  Occupational History   Not on file  Tobacco Use   Smoking status: Never   Smokeless tobacco: Never  Substance and Sexual Activity   Alcohol use: No   Drug use: No   Sexual activity: Not on file  Other Topics Concern   Not on file  Social History Narrative   Not on file   Social Determinants of Health   Financial Resource Strain: Not on file  Food Insecurity: Not on file  Transportation Needs: Not on file  Physical Activity: Not on file  Stress: Not on file  Social Connections: Not on file  Intimate Partner Violence: Not on file    Family History  Problem Relation Age of Onset   Dementia Mother    Cancer - Lung Brother  Dementia Brother    Colon polyps Brother    Colon cancer Neg Hx    Rectal cancer Neg Hx    Stomach cancer Neg Hx     ROS: no fevers or chills, productive cough, hemoptysis, dysphasia, odynophagia, melena, hematochezia, dysuria, hematuria, rash, seizure activity, orthopnea, PND, pedal edema, claudication. Remaining systems are negative.  Physical Exam: Well-developed well-nourished in no acute distress.  Skin is warm and dry.  HEENT is normal.  Neck is supple.  Chest is clear to auscultation with normal expansion.  Cardiovascular exam is regular rate and rhythm.  Abdominal exam nontender or distended. No masses palpated. Extremities show no edema. neuro grossly intact  ECG- personally reviewed  A/P  1 PAF-Pt remains in sinus; continue cardizem PRN and xarelto; check Hgb and renal function.  2 hypertension-BP controlled; continue present medical regimen.  3 palpitations-no recent symptoms.  4 hyperlipidemia-per primary  care.  Kirk Ruths, MD

## 2021-04-23 ENCOUNTER — Telehealth: Payer: Self-pay | Admitting: Cardiology

## 2021-04-23 NOTE — Telephone Encounter (Signed)
Spoke with the patient who states that she had a message from [place], [place] about switching her appointment with Dr. Stanford Breed to Thursday. Appointment has been changed.

## 2021-04-23 NOTE — Telephone Encounter (Signed)
Pt is returning call from earlier today. Please advise pt further 

## 2021-04-24 ENCOUNTER — Ambulatory Visit: Payer: PPO | Admitting: Cardiology

## 2021-04-24 NOTE — Progress Notes (Signed)
HPI: FU atrial fibrillation.  Patient seen in the emergency room December 2018 with atrial fibrillation.  She converted to sinus rhythm spontaneously.  Patient treated with Cardizem and Xarelto.  TSH was normal.  Seen by Dr. Einar Gip and nuclear study January 2019 showed no ischemia and echocardiogram showed normal ejection fraction and mild mitral regurgitation. Since last seen there is no dyspnea, chest pain, syncope or falls.  Minimal pedal edema.  No bleeding.  Current Outpatient Medications  Medication Sig Dispense Refill   acetaminophen (TYLENOL 8 HOUR ARTHRITIS PAIN) 650 MG CR tablet Take 650 mg by mouth every 8 (eight) hours as needed for pain.     Carboxymethylcellulose Sod PF 0.5 % SOLN 1 drop 3 (three) times daily as needed.     cholecalciferol (VITAMIN D) 1000 UNITS tablet Take 1,000 Units by mouth daily.      diltiazem (CARDIZEM) 30 MG tablet TAKE 1 TABELT EVERY 4 HOURS AS NEEDED FOR AFIB HEART RATE OVER 100. 45 tablet 2   ELIQUIS 5 MG TABS tablet Take 5 mg by mouth 2 (two) times daily.     esomeprazole (NEXIUM) 40 MG capsule Take by mouth.     Famotidine (PEPCID PO) Take 1 tablet by mouth daily as needed.      hydrochlorothiazide (HYDRODIURIL) 12.5 MG tablet Take 12.5 mg by mouth every other day.     LORazepam (ATIVAN) 0.5 MG tablet Take by mouth.     potassium chloride (KLOR-CON) 20 MEQ packet Take 20 mEq by mouth daily.     sertraline (ZOLOFT) 50 MG tablet Take 50 mg by mouth daily.     XARELTO 20 MG TABS tablet Take 1 tablet by mouth daily. (Patient not taking: Reported on 04/26/2021)     No current facility-administered medications for this visit.     Past Medical History:  Diagnosis Date   Allergy    occasionally    Anxiety    closterphobia   Arthritis    knees and thumbs    Cataract    bilaterally and removed   Colon polyps    Eye pressure    elevated eye pressure because of thickened cornea but monitoring for glaucona   GERD (gastroesophageal reflux  disease)    Hyperlipidemia    Hypertension    IBS (irritable bowel syndrome)    Osteopenia    Other fracture of right great toe, subsequent encounter for fracture with routine healing    Sciatica     Past Surgical History:  Procedure Laterality Date   APPENDECTOMY     COLONOSCOPY     DILATION AND CURETTAGE OF UTERUS     x 6    ENDOMETRIAL ABLATION     EXPLORATORY LAPAROTOMY     POLYPECTOMY     TONSILLECTOMY      Social History   Socioeconomic History   Marital status: Widowed    Spouse name: Not on file   Number of children: Not on file   Years of education: Not on file   Highest education level: Not on file  Occupational History   Not on file  Tobacco Use   Smoking status: Never   Smokeless tobacco: Never  Substance and Sexual Activity   Alcohol use: No   Drug use: No   Sexual activity: Not on file  Other Topics Concern   Not on file  Social History Narrative   Not on file   Social Determinants of Health   Financial Resource Strain: Not  on file  Food Insecurity: Not on file  Transportation Needs: Not on file  Physical Activity: Not on file  Stress: Not on file  Social Connections: Not on file  Intimate Partner Violence: Not on file    Family History  Problem Relation Age of Onset   Dementia Mother    Cancer - Lung Brother    Dementia Brother    Colon polyps Brother    Colon cancer Neg Hx    Rectal cancer Neg Hx    Stomach cancer Neg Hx     ROS: no fevers or chills, productive cough, hemoptysis, dysphasia, odynophagia, melena, hematochezia, dysuria, hematuria, rash, seizure activity, orthopnea, PND, pedal edema, claudication. Remaining systems are negative.  Physical Exam: Well-developed obese in no acute distress.  Skin is warm and dry.  HEENT is normal.  Neck is supple.  Chest is clear to auscultation with normal expansion.  Cardiovascular exam is regular rate and rhythm.  Abdominal exam nontender or distended. No masses  palpated. Extremities show trace edema. neuro grossly intact  ECG-normal sinus rhythm at a rate of 76, no ST changes.  Personally reviewed  A/P  1 PAF-Pt remains in sinus; continue cardizem PRN; she feels as though she may be having side effects from Eliquis including indigestion.  We will change to Xarelto.  Check hemoglobin and renal function and will dose Xarelto based on GFR.  2 hypertension-BP elevated; however he follows this at home and it is controlled.  We will continue present medications and follow-up.  3 palpitations-no recent symptoms.  4 hyperlipidemia-per primary care.  Kirk Ruths, MD

## 2021-04-26 ENCOUNTER — Encounter: Payer: Self-pay | Admitting: Cardiology

## 2021-04-26 ENCOUNTER — Ambulatory Visit: Payer: PPO | Admitting: Cardiology

## 2021-04-26 ENCOUNTER — Other Ambulatory Visit: Payer: Self-pay

## 2021-04-26 VITALS — BP 152/84 | HR 76 | Ht 62.5 in | Wt 221.2 lb

## 2021-04-26 DIAGNOSIS — Z79899 Other long term (current) drug therapy: Secondary | ICD-10-CM

## 2021-04-26 DIAGNOSIS — I480 Paroxysmal atrial fibrillation: Secondary | ICD-10-CM | POA: Diagnosis not present

## 2021-04-26 DIAGNOSIS — I10 Essential (primary) hypertension: Secondary | ICD-10-CM

## 2021-04-26 DIAGNOSIS — E7800 Pure hypercholesterolemia, unspecified: Secondary | ICD-10-CM | POA: Diagnosis not present

## 2021-04-26 LAB — BASIC METABOLIC PANEL
BUN/Creatinine Ratio: 21 (ref 12–28)
BUN: 16 mg/dL (ref 8–27)
CO2: 24 mmol/L (ref 20–29)
Calcium: 10.5 mg/dL — ABNORMAL HIGH (ref 8.7–10.3)
Chloride: 105 mmol/L (ref 96–106)
Creatinine, Ser: 0.75 mg/dL (ref 0.57–1.00)
Glucose: 94 mg/dL (ref 65–99)
Potassium: 4.3 mmol/L (ref 3.5–5.2)
Sodium: 143 mmol/L (ref 134–144)
eGFR: 81 mL/min/{1.73_m2} (ref >59)

## 2021-04-26 LAB — CBC
Hematocrit: 41.2 % (ref 34.0–46.6)
Hemoglobin: 14.1 g/dL (ref 11.1–15.9)
MCH: 29.9 pg (ref 26.6–33.0)
MCHC: 34.2 g/dL (ref 31.5–35.7)
MCV: 87 fL (ref 79–97)
Platelets: 176 10*3/uL (ref 150–450)
RBC: 4.72 x10E6/uL (ref 3.77–5.28)
RDW: 12.9 % (ref 11.7–15.4)
WBC: 6.1 10*3/uL (ref 3.4–10.8)

## 2021-04-26 MED ORDER — DILTIAZEM HCL 30 MG PO TABS: ORAL_TABLET | ORAL | 6 refills | Status: DC

## 2021-04-26 NOTE — Patient Instructions (Signed)
Medication Instructions:   STOP ELIQUIS  *If you need a refill on your cardiac medications before your next appointment, please call your pharmacy*   Lab Work:  Your physician recommends that you HAVE LAB WORK TODAY  If you have labs (blood work) drawn today and your tests are completely normal, you will receive your results only by: [place] (if you have MyChart) OR A paper copy in the mail If you have any lab test that is abnormal or we need to change your treatment, we will call you to review the results.   Follow-Up: At [facility], you and your health needs are our priority.  As part of our continuing mission to provide you with exceptional heart care, we have created designated Provider Care Teams.  These Care Teams include your primary Cardiologist (physician) and Advanced Practice Providers (APPs -  Physician Assistants and Nurse Practitioners) who all work together to provide you with the care you need, when you need it.  We recommend signing up for the patient portal called "MyChart".  Sign up information is provided on this After Visit Summary.  MyChart is used to connect with patients for Virtual Visits (Telemedicine).  Patients are able to view lab/test results, encounter notes, upcoming appointments, etc.  Non-urgent messages can be sent to your provider as well.   To learn more about what you can do with MyChart, go to [email or web address].    Your next appointment:   12 month(s)  The format for your next appointment:   In Person  Provider:   Kirk Ruths, MD

## 2021-04-27 ENCOUNTER — Telehealth: Payer: Self-pay | Admitting: Cardiology

## 2021-04-27 MED ORDER — XARELTO 20 MG PO TABS: 20.0000 mg | ORAL_TABLET | Freq: Every day | ORAL | 3 refills | Status: DC

## 2021-04-27 NOTE — Telephone Encounter (Signed)
Patient calling in to see if Dr. Stanford Breed is going to switch her medication out base on her blood work. Please advise

## 2021-04-27 NOTE — Telephone Encounter (Signed)
Xarelto dose should be 20 mg daily; apixaban DCed.  Kirk Ruths    Spoke with pt, Aware of dr Jacalyn Lefevre recommendations.  New script sent to the pharmacy

## 2021-05-01 DIAGNOSIS — M179 Osteoarthritis of knee, unspecified: Secondary | ICD-10-CM | POA: Diagnosis not present

## 2021-05-01 DIAGNOSIS — I480 Paroxysmal atrial fibrillation: Secondary | ICD-10-CM | POA: Diagnosis not present

## 2021-05-01 DIAGNOSIS — I10 Essential (primary) hypertension: Secondary | ICD-10-CM | POA: Diagnosis not present

## 2021-05-01 DIAGNOSIS — F4321 Adjustment disorder with depressed mood: Secondary | ICD-10-CM | POA: Diagnosis not present

## 2021-05-01 DIAGNOSIS — M8580 Other specified disorders of bone density and structure, unspecified site: Secondary | ICD-10-CM | POA: Diagnosis not present

## 2021-05-01 DIAGNOSIS — F325 Major depressive disorder, single episode, in full remission: Secondary | ICD-10-CM | POA: Diagnosis not present

## 2021-05-01 DIAGNOSIS — E7800 Pure hypercholesterolemia, unspecified: Secondary | ICD-10-CM | POA: Diagnosis not present

## 2021-05-01 DIAGNOSIS — M170 Bilateral primary osteoarthritis of knee: Secondary | ICD-10-CM | POA: Diagnosis not present

## 2021-05-01 DIAGNOSIS — K219 Gastro-esophageal reflux disease without esophagitis: Secondary | ICD-10-CM | POA: Diagnosis not present

## 2021-05-02 DIAGNOSIS — H524 Presbyopia: Secondary | ICD-10-CM | POA: Diagnosis not present

## 2021-05-02 DIAGNOSIS — H04123 Dry eye syndrome of bilateral lacrimal glands: Secondary | ICD-10-CM | POA: Diagnosis not present

## 2021-05-02 DIAGNOSIS — H40053 Ocular hypertension, bilateral: Secondary | ICD-10-CM | POA: Diagnosis not present

## 2021-05-02 DIAGNOSIS — Z961 Presence of intraocular lens: Secondary | ICD-10-CM | POA: Diagnosis not present

## 2021-05-11 DIAGNOSIS — Z1231 Encounter for screening mammogram for malignant neoplasm of breast: Secondary | ICD-10-CM | POA: Diagnosis not present

## 2021-05-28 DIAGNOSIS — M25562 Pain in left knee: Secondary | ICD-10-CM | POA: Diagnosis not present

## 2021-05-28 DIAGNOSIS — M25561 Pain in right knee: Secondary | ICD-10-CM | POA: Diagnosis not present

## 2021-06-04 DIAGNOSIS — M25561 Pain in right knee: Secondary | ICD-10-CM | POA: Diagnosis not present

## 2021-06-04 DIAGNOSIS — M25562 Pain in left knee: Secondary | ICD-10-CM | POA: Diagnosis not present

## 2021-06-11 DIAGNOSIS — M25562 Pain in left knee: Secondary | ICD-10-CM | POA: Diagnosis not present

## 2021-06-11 DIAGNOSIS — M25561 Pain in right knee: Secondary | ICD-10-CM | POA: Diagnosis not present

## 2021-06-14 DIAGNOSIS — H608X3 Other otitis externa, bilateral: Secondary | ICD-10-CM | POA: Diagnosis not present

## 2021-06-14 DIAGNOSIS — D329 Benign neoplasm of meninges, unspecified: Secondary | ICD-10-CM | POA: Diagnosis not present

## 2021-06-14 DIAGNOSIS — H6123 Impacted cerumen, bilateral: Secondary | ICD-10-CM | POA: Diagnosis not present

## 2021-06-14 DIAGNOSIS — H903 Sensorineural hearing loss, bilateral: Secondary | ICD-10-CM | POA: Diagnosis not present

## 2021-06-15 DIAGNOSIS — I10 Essential (primary) hypertension: Secondary | ICD-10-CM | POA: Diagnosis not present

## 2021-06-15 DIAGNOSIS — E7800 Pure hypercholesterolemia, unspecified: Secondary | ICD-10-CM | POA: Diagnosis not present

## 2021-06-18 DIAGNOSIS — I10 Essential (primary) hypertension: Secondary | ICD-10-CM | POA: Diagnosis not present

## 2021-06-18 DIAGNOSIS — M4696 Unspecified inflammatory spondylopathy, lumbar region: Secondary | ICD-10-CM | POA: Diagnosis not present

## 2021-06-18 DIAGNOSIS — Z8719 Personal history of other diseases of the digestive system: Secondary | ICD-10-CM | POA: Diagnosis not present

## 2021-06-18 DIAGNOSIS — Z6841 Body Mass Index (BMI) 40.0 and over, adult: Secondary | ICD-10-CM | POA: Diagnosis not present

## 2021-06-18 DIAGNOSIS — M170 Bilateral primary osteoarthritis of knee: Secondary | ICD-10-CM | POA: Diagnosis not present

## 2021-06-18 DIAGNOSIS — Z1389 Encounter for screening for other disorder: Secondary | ICD-10-CM | POA: Diagnosis not present

## 2021-06-18 DIAGNOSIS — I480 Paroxysmal atrial fibrillation: Secondary | ICD-10-CM | POA: Diagnosis not present

## 2021-06-18 DIAGNOSIS — E7800 Pure hypercholesterolemia, unspecified: Secondary | ICD-10-CM | POA: Diagnosis not present

## 2021-06-18 DIAGNOSIS — E210 Primary hyperparathyroidism: Secondary | ICD-10-CM | POA: Diagnosis not present

## 2021-06-18 DIAGNOSIS — F325 Major depressive disorder, single episode, in full remission: Secondary | ICD-10-CM | POA: Diagnosis not present

## 2021-06-18 DIAGNOSIS — Z23 Encounter for immunization: Secondary | ICD-10-CM | POA: Diagnosis not present

## 2021-06-18 DIAGNOSIS — Z0000 Encounter for general adult medical examination without abnormal findings: Secondary | ICD-10-CM | POA: Diagnosis not present

## 2021-06-19 DIAGNOSIS — M25562 Pain in left knee: Secondary | ICD-10-CM | POA: Diagnosis not present

## 2021-06-19 DIAGNOSIS — R635 Abnormal weight gain: Secondary | ICD-10-CM | POA: Diagnosis not present

## 2021-06-19 DIAGNOSIS — M25561 Pain in right knee: Secondary | ICD-10-CM | POA: Diagnosis not present

## 2021-06-19 DIAGNOSIS — Z124 Encounter for screening for malignant neoplasm of cervix: Secondary | ICD-10-CM | POA: Diagnosis not present

## 2021-06-19 DIAGNOSIS — Z6841 Body Mass Index (BMI) 40.0 and over, adult: Secondary | ICD-10-CM | POA: Diagnosis not present

## 2021-06-25 DIAGNOSIS — M25561 Pain in right knee: Secondary | ICD-10-CM | POA: Diagnosis not present

## 2021-06-25 DIAGNOSIS — M25562 Pain in left knee: Secondary | ICD-10-CM | POA: Diagnosis not present

## 2021-07-20 DIAGNOSIS — M8580 Other specified disorders of bone density and structure, unspecified site: Secondary | ICD-10-CM | POA: Diagnosis not present

## 2021-07-20 DIAGNOSIS — K219 Gastro-esophageal reflux disease without esophagitis: Secondary | ICD-10-CM | POA: Diagnosis not present

## 2021-07-20 DIAGNOSIS — F325 Major depressive disorder, single episode, in full remission: Secondary | ICD-10-CM | POA: Diagnosis not present

## 2021-07-20 DIAGNOSIS — I10 Essential (primary) hypertension: Secondary | ICD-10-CM | POA: Diagnosis not present

## 2021-07-20 DIAGNOSIS — I480 Paroxysmal atrial fibrillation: Secondary | ICD-10-CM | POA: Diagnosis not present

## 2021-07-20 DIAGNOSIS — E7800 Pure hypercholesterolemia, unspecified: Secondary | ICD-10-CM | POA: Diagnosis not present

## 2021-07-20 DIAGNOSIS — F4321 Adjustment disorder with depressed mood: Secondary | ICD-10-CM | POA: Diagnosis not present

## 2021-07-20 DIAGNOSIS — M170 Bilateral primary osteoarthritis of knee: Secondary | ICD-10-CM | POA: Diagnosis not present

## 2021-08-09 IMAGING — MR MR HEAD WO/W CM
7 of 14 series · 21 of 48 positions shown · IV contrast (10 GAD)
Comparison: 10/12/2018

CLINICAL DATA: Meningioma follow-up

EXAM:
MRI HEAD WITHOUT AND WITH CONTRAST
TECHNIQUE: Multiplanar, multiecho pulse sequences of the brain and surrounding
structures were obtained without and with intravenous contrast.
CONTRAST:  10mL GADAVIST GADOBUTROL 1 MMOL/ML IV SOLN

[Series 2: DWI · axial · 3.0mm · 0.94mm/px · z∈[-74,+58]mm · 7 of 90 slices shown (1 of 2)]
[im 1/90]
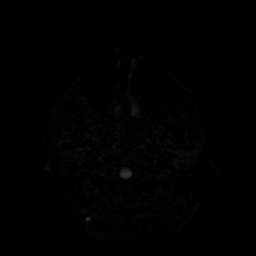
[im 15/90]
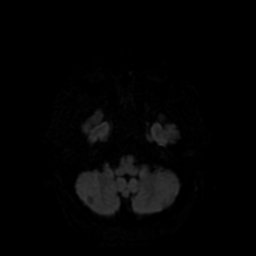
[im 30/90]
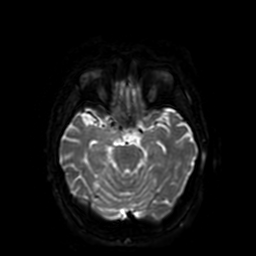
[im 45/90]
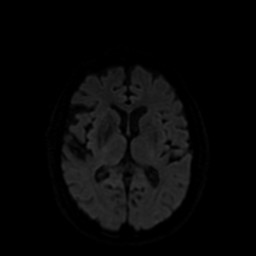
[im 60/90]
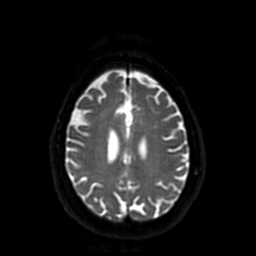
[im 75/90]
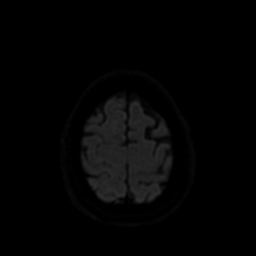
[im 90/90]
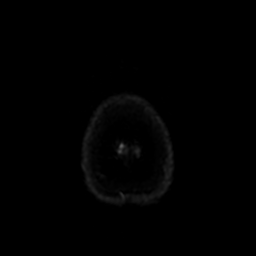

[Series 3: DWI · coronal · 4.0mm · 0.94mm/px · 4 of 60 slices shown (2 of 2)]
[im 1/60]
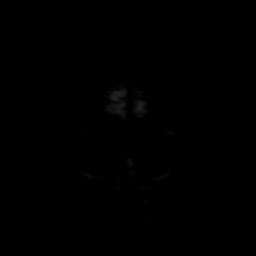
[im 20/60]
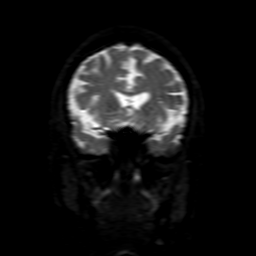
[im 40/60]
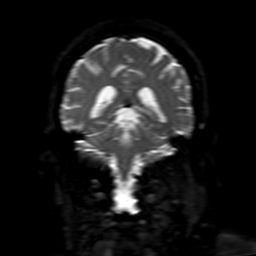
[im 60/60]
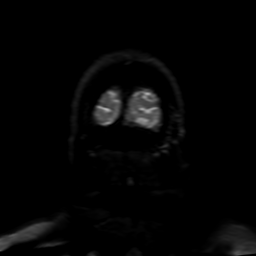

[Series 4: FLAIR · sagittal · 5.0mm · 0.23mm/px · 2 of 23 slices shown (1 of 2)]
[im 1/23]
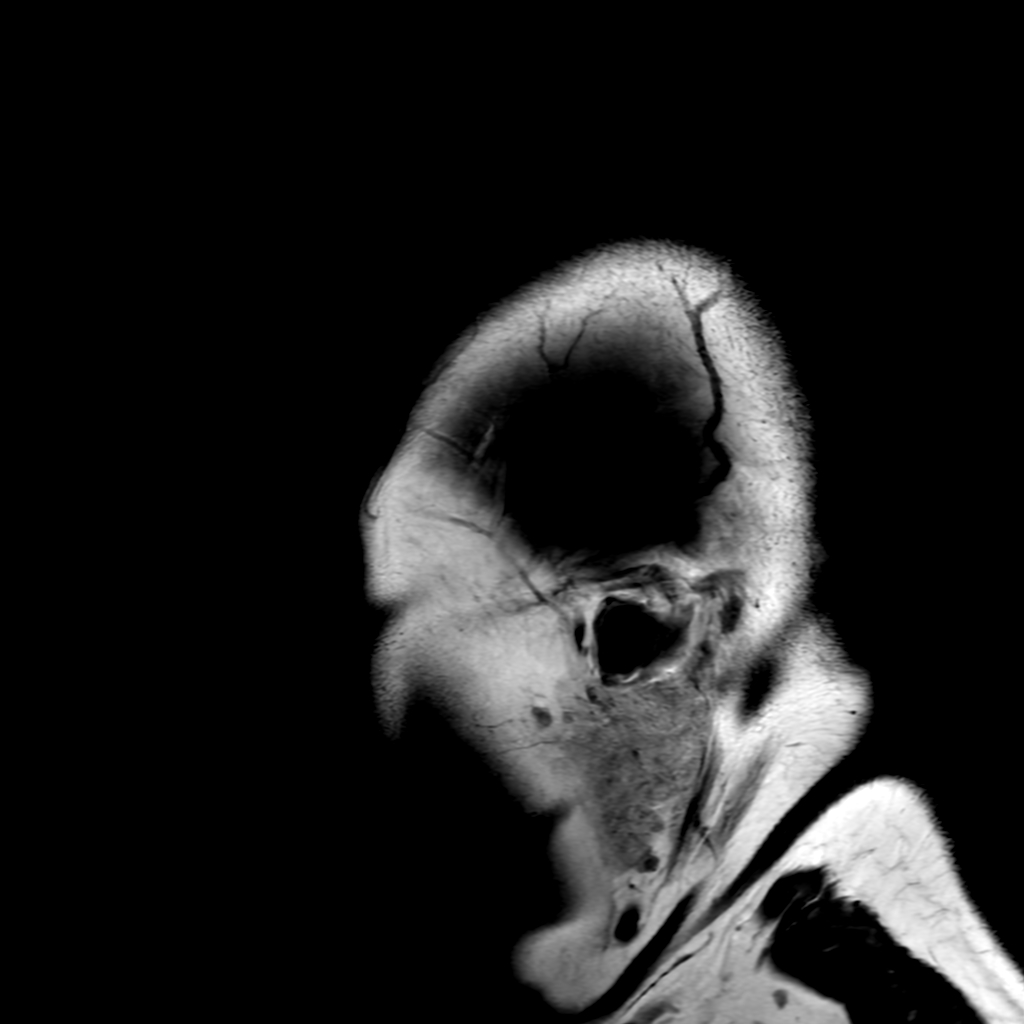
[im 23/23]
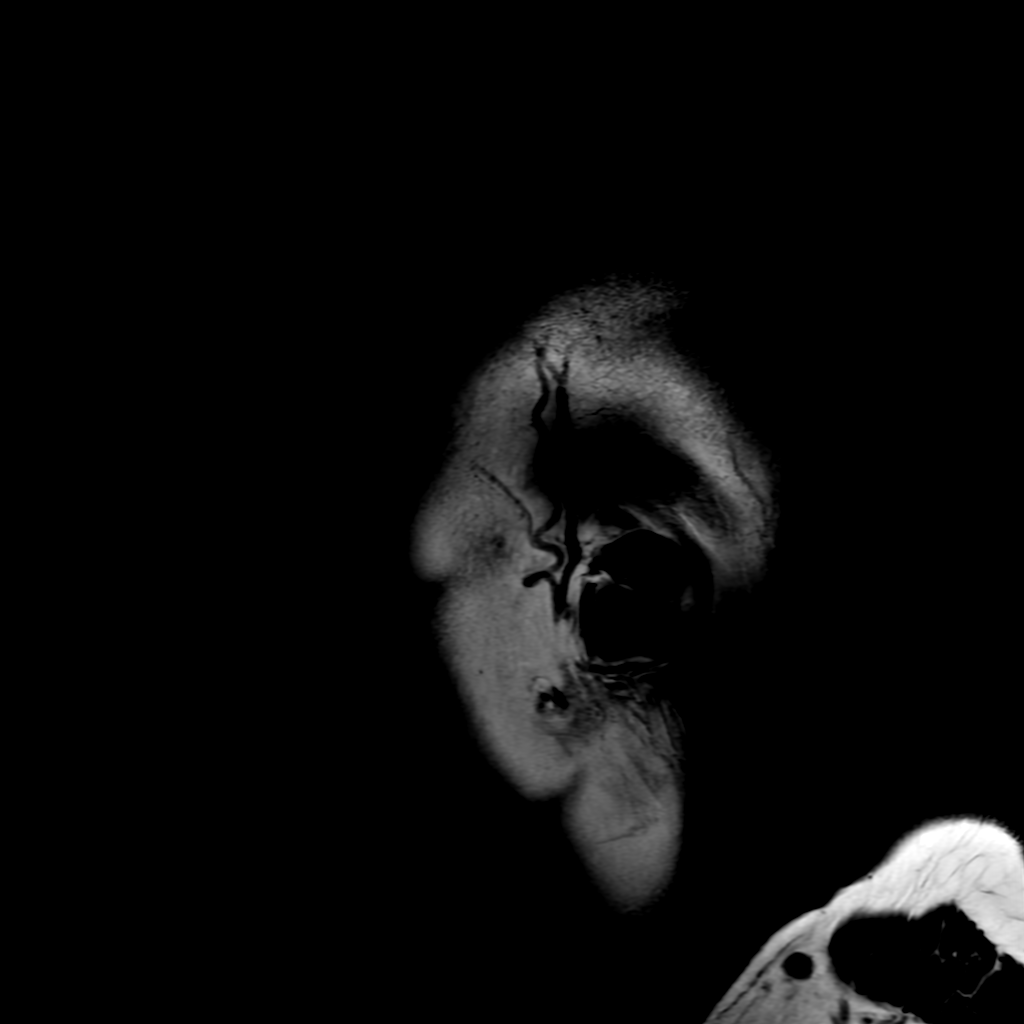

[Series 6: FLAIR · axial · 3.0mm · 0.41mm/px · z∈[-71,+56]mm · 2 of 33 slices shown (2 of 2)]
[im 1/33]
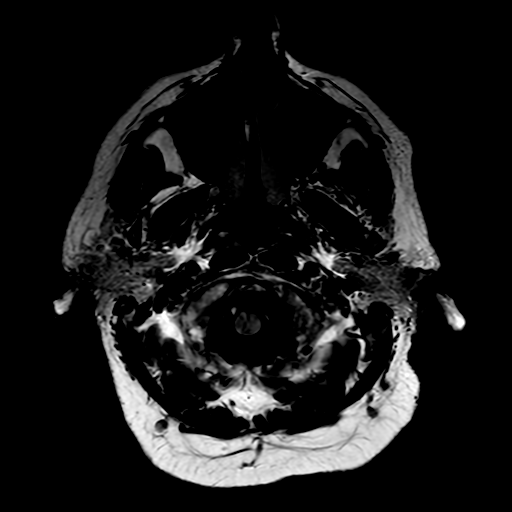
[im 33/33]
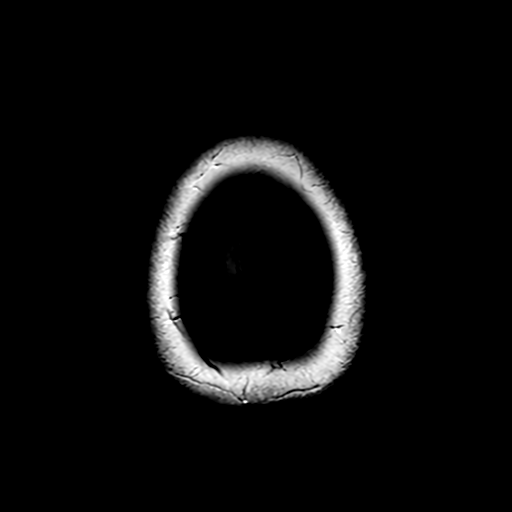

[Series 12: T1 post-contrast · sagittal · 5.0mm · 0.23mm/px · 1 of 23 slices shown]
[im 1/23]
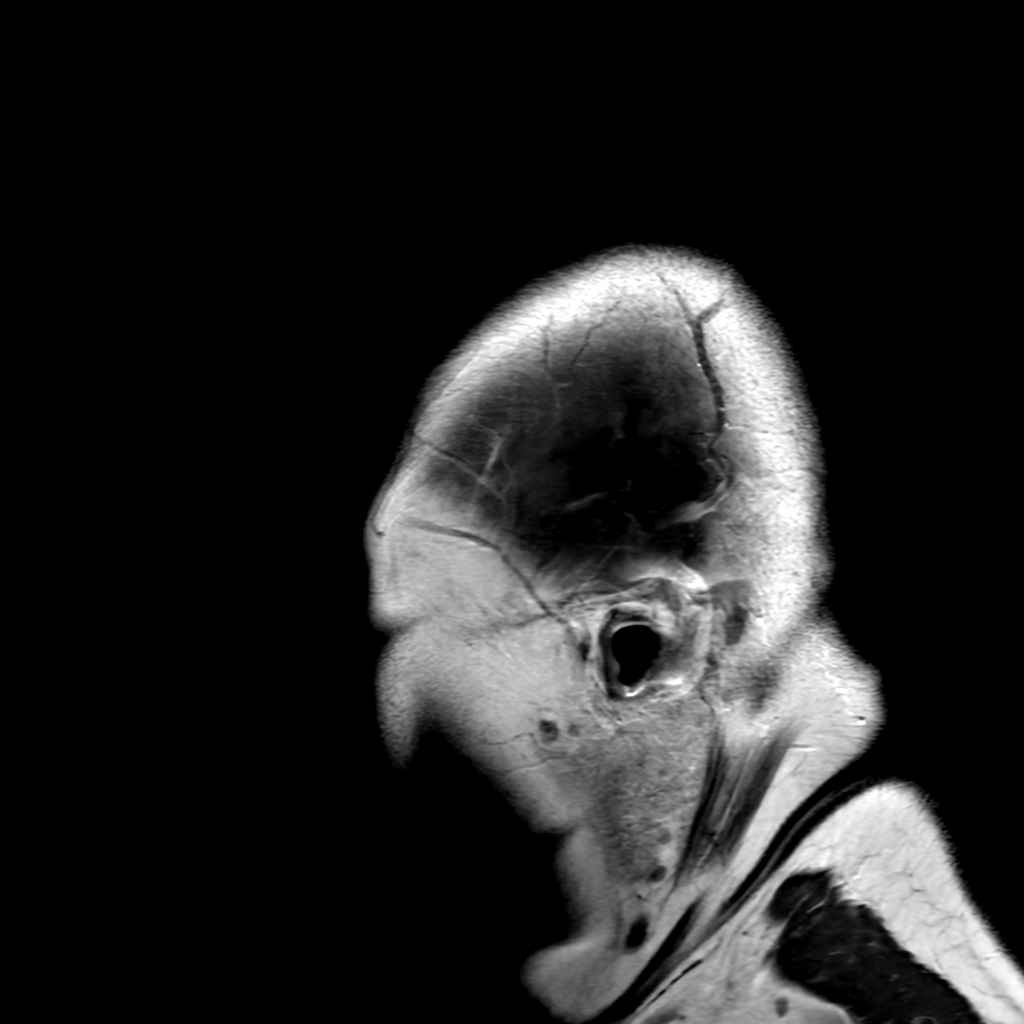

[Series 250: ADC · axial · 3.0mm · 0.94mm/px · z∈[-74,+58]mm · 3 of 45 slices shown (1 of 2)]
[im 1/45]
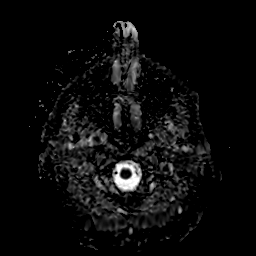
[im 23/45]
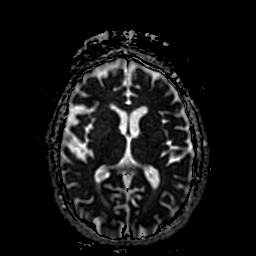
[im 45/45]
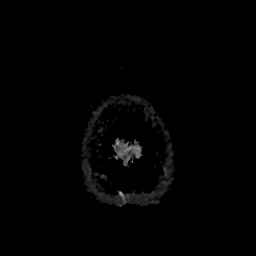

[Series 350: ADC · coronal · 4.0mm · 0.94mm/px · 2 of 30 slices shown (2 of 2)]
[im 1/30]
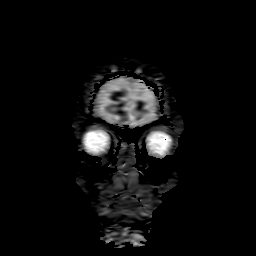
[im 30/30]
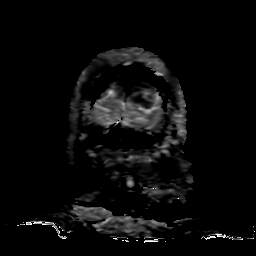

[21 of 48 positions shown; findings below may reference images not displayed]

FINDINGS: BRAIN: There is no acute infarct, acute hemorrhage or extra-axial
collection. Multifocal white matter hyperintensity, most commonly
due to chronic ischemic microangiopathy. There is generalized
atrophy without lobar predilection. Right paraclinoid meningioma
measures 3.2 cm AP x 2.2 cm TV x 2.7 cm CC , unchanged. The mass
encases the right supraclinoid ICA, proximal MCA and proximal ACA.
Mass effect on the right optic nerve is unchanged.

VASCULAR: The major intracranial arterial and venous sinus flow
voids are normal. Susceptibility-sensitive sequences show no chronic
microhemorrhage or superficial siderosis.

SKULL AND UPPER CERVICAL SPINE: Calvarial bone marrow signal is
normal. There is no skull base mass. The visualized upper cervical
spine and soft tissues are normal.

SINUSES/ORBITS: There are no fluid levels or advanced mucosal
thickening. The mastoid air cells and middle ear cavities are free
of fluid. The orbits are normal.
IMPRESSION: 1. No acute intracranial process.
2. Unchanged right paraclinoid meningioma with unchanged mass effect
on the right optic nerve.

## 2021-09-24 DIAGNOSIS — H16101 Unspecified superficial keratitis, right eye: Secondary | ICD-10-CM | POA: Diagnosis not present

## 2021-09-24 DIAGNOSIS — H1820 Unspecified corneal edema: Secondary | ICD-10-CM | POA: Diagnosis not present

## 2021-10-04 DIAGNOSIS — L821 Other seborrheic keratosis: Secondary | ICD-10-CM | POA: Diagnosis not present

## 2021-10-04 DIAGNOSIS — D1801 Hemangioma of skin and subcutaneous tissue: Secondary | ICD-10-CM | POA: Diagnosis not present

## 2021-10-04 DIAGNOSIS — L570 Actinic keratosis: Secondary | ICD-10-CM | POA: Diagnosis not present

## 2021-10-04 DIAGNOSIS — L820 Inflamed seborrheic keratosis: Secondary | ICD-10-CM | POA: Diagnosis not present

## 2021-10-04 DIAGNOSIS — D224 Melanocytic nevi of scalp and neck: Secondary | ICD-10-CM | POA: Diagnosis not present

## 2021-10-04 DIAGNOSIS — L814 Other melanin hyperpigmentation: Secondary | ICD-10-CM | POA: Diagnosis not present

## 2021-10-04 DIAGNOSIS — D2271 Melanocytic nevi of right lower limb, including hip: Secondary | ICD-10-CM | POA: Diagnosis not present

## 2021-10-04 DIAGNOSIS — D2272 Melanocytic nevi of left lower limb, including hip: Secondary | ICD-10-CM | POA: Diagnosis not present

## 2021-10-05 DIAGNOSIS — M170 Bilateral primary osteoarthritis of knee: Secondary | ICD-10-CM | POA: Diagnosis not present

## 2021-10-09 DIAGNOSIS — H18832 Recurrent erosion of cornea, left eye: Secondary | ICD-10-CM | POA: Diagnosis not present

## 2021-10-09 DIAGNOSIS — H16223 Keratoconjunctivitis sicca, not specified as Sjogren's, bilateral: Secondary | ICD-10-CM | POA: Diagnosis not present

## 2021-10-09 DIAGNOSIS — H0100A Unspecified blepharitis right eye, upper and lower eyelids: Secondary | ICD-10-CM | POA: Diagnosis not present

## 2021-10-09 DIAGNOSIS — H0100B Unspecified blepharitis left eye, upper and lower eyelids: Secondary | ICD-10-CM | POA: Diagnosis not present

## 2021-10-10 DIAGNOSIS — H18832 Recurrent erosion of cornea, left eye: Secondary | ICD-10-CM | POA: Diagnosis not present

## 2021-10-10 DIAGNOSIS — H16101 Unspecified superficial keratitis, right eye: Secondary | ICD-10-CM | POA: Diagnosis not present

## 2021-10-10 DIAGNOSIS — H0100A Unspecified blepharitis right eye, upper and lower eyelids: Secondary | ICD-10-CM | POA: Diagnosis not present

## 2021-10-10 DIAGNOSIS — H0100B Unspecified blepharitis left eye, upper and lower eyelids: Secondary | ICD-10-CM | POA: Diagnosis not present

## 2021-10-12 DIAGNOSIS — H18832 Recurrent erosion of cornea, left eye: Secondary | ICD-10-CM | POA: Diagnosis not present

## 2021-10-12 DIAGNOSIS — H5712 Ocular pain, left eye: Secondary | ICD-10-CM | POA: Diagnosis not present

## 2021-10-12 DIAGNOSIS — H16101 Unspecified superficial keratitis, right eye: Secondary | ICD-10-CM | POA: Diagnosis not present

## 2021-10-16 ENCOUNTER — Telehealth: Payer: Self-pay | Admitting: *Deleted

## 2021-10-16 DIAGNOSIS — H16101 Unspecified superficial keratitis, right eye: Secondary | ICD-10-CM | POA: Diagnosis not present

## 2021-10-16 DIAGNOSIS — H1820 Unspecified corneal edema: Secondary | ICD-10-CM | POA: Diagnosis not present

## 2021-10-16 DIAGNOSIS — H40023 Open angle with borderline findings, high risk, bilateral: Secondary | ICD-10-CM | POA: Diagnosis not present

## 2021-10-16 DIAGNOSIS — H18832 Recurrent erosion of cornea, left eye: Secondary | ICD-10-CM | POA: Diagnosis not present

## 2021-10-16 NOTE — Telephone Encounter (Signed)
° °  Pre-operative Risk Assessment    Patient Name: Amber Sims  DOB: August 07, 1941 MRN: [id number]      Request for Surgical Clearance    Procedure:   left total knee arthoplasty      at [facility]   Date of Surgery:  Clearance 03/18/22                                 Surgeon:  Dr Gaynelle Arabian  Surgeon's Group or Practice Name:  [facility] Phone number:  [phone]  Fax number:  [phone]   Type of Clearance Requested:   - Medical  - Pharmacy:  Hold Rivaroxaban (Xarelto)     Type of Anesthesia:   choice   Additional requests/questions:  Please advise surgeon/provider what medications should be held.  Olin Pia   10/16/2021, 12:37 PM

## 2021-10-16 NOTE — Telephone Encounter (Signed)
Pharmacy can you please comment on how long patient can hold xarelto for total knee procedure.  Thank you

## 2021-10-17 NOTE — Telephone Encounter (Addendum)
Left detailed message for the pt to call the office to schedule an appt for pre op clearance. Pt can see Dr. Stanford Breed or APP between May and June so that we may have time in the event testing needs to be done for pre op clearance. I will update the surgeon's office the pt will need an appt for pre op clearance.

## 2021-10-17 NOTE — Telephone Encounter (Signed)
Pt has been scheduled to see Sande Rives, [facility] 01/02/22. I will forward notes to [facility] for upcoming appt. Will send FYI to requesting office the pt has appt 01/02/22.

## 2021-10-17 NOTE — Telephone Encounter (Signed)
Patient with diagnosis of afib on Xarelto for anticoagulation.    Procedure: left TKA Date of procedure: 03/18/22  CHA2DS2-VASc Score = 4  This indicates a 4.8% annual risk of stroke. The patient's score is based upon: CHF History: 0 HTN History: 1 Diabetes History: 0 Stroke History: 0 Vascular Disease History: 0 Age Score: 2 Gender Score: 1   CrCl 98mL/min using adjusted body weight Platelet count 176K  Per office protocol, patient can hold Xarelto for 3 days prior to procedure.

## 2021-10-17 NOTE — Telephone Encounter (Signed)
° °  Name: Amber Sims  DOB: May 06, 1941  MRN: [id number]  Primary Cardiologist: Dr. Stanford Breed  Chart reviewed as part of pre-operative protocol coverage. Because of Nevena Rozenberg Martucci's past medical history and time since last visit, she will require a follow-up visit in order to better assess preoperative cardiovascular risk. Last OV was in 04/2021, but surgery date is not until 03/2022 - it is challenging to assess clearance so far in advance by phone. Would recommend scheduling her for her yearly visit with [place] in late June 2023 to also address preop clearance at that time, in case there are any issues arise that would affect preop evaluation.  Pre-op covering staff: - Please schedule appointment and call patient to inform them.  - Please contact requesting surgeon's office via preferred method (i.e, phone, fax) to inform them of need for appointment prior to surgery.  Pharm recs noted below for review at time of OV.  Charlie Pitter, PA-C  10/17/2021, 10:07 AM

## 2021-10-24 DIAGNOSIS — I480 Paroxysmal atrial fibrillation: Secondary | ICD-10-CM | POA: Diagnosis not present

## 2021-10-24 DIAGNOSIS — I10 Essential (primary) hypertension: Secondary | ICD-10-CM | POA: Diagnosis not present

## 2021-11-16 DIAGNOSIS — H04123 Dry eye syndrome of bilateral lacrimal glands: Secondary | ICD-10-CM | POA: Diagnosis not present

## 2021-11-16 DIAGNOSIS — H01005 Unspecified blepharitis left lower eyelid: Secondary | ICD-10-CM | POA: Diagnosis not present

## 2021-11-16 DIAGNOSIS — H01002 Unspecified blepharitis right lower eyelid: Secondary | ICD-10-CM | POA: Diagnosis not present

## 2021-11-16 DIAGNOSIS — H18832 Recurrent erosion of cornea, left eye: Secondary | ICD-10-CM | POA: Diagnosis not present

## 2021-12-13 DIAGNOSIS — H903 Sensorineural hearing loss, bilateral: Secondary | ICD-10-CM | POA: Diagnosis not present

## 2021-12-13 DIAGNOSIS — D329 Benign neoplasm of meninges, unspecified: Secondary | ICD-10-CM | POA: Diagnosis not present

## 2021-12-13 DIAGNOSIS — H6063 Unspecified chronic otitis externa, bilateral: Secondary | ICD-10-CM | POA: Diagnosis not present

## 2021-12-13 DIAGNOSIS — H6123 Impacted cerumen, bilateral: Secondary | ICD-10-CM | POA: Diagnosis not present

## 2021-12-13 DIAGNOSIS — H60543 Acute eczematoid otitis externa, bilateral: Secondary | ICD-10-CM | POA: Diagnosis not present

## 2021-12-18 DIAGNOSIS — F419 Anxiety disorder, unspecified: Secondary | ICD-10-CM | POA: Diagnosis not present

## 2021-12-18 DIAGNOSIS — Z7189 Other specified counseling: Secondary | ICD-10-CM | POA: Diagnosis not present

## 2021-12-18 DIAGNOSIS — I10 Essential (primary) hypertension: Secondary | ICD-10-CM | POA: Diagnosis not present

## 2021-12-18 DIAGNOSIS — M170 Bilateral primary osteoarthritis of knee: Secondary | ICD-10-CM | POA: Diagnosis not present

## 2021-12-25 NOTE — Progress Notes (Deleted)
Cardiology Office Note:    Date:  12/25/2021   ID:  Amber Sims, DOB 03/19/1941, MRN [id number]  PCP:  Lavone Orn, MD  Cardiologist:  None  Electrophysiologist:  None   Referring MD: Lavone Orn, MD   Chief Complaint: pre-op evaluation  History of Present Illness:    Amber Sims is a 81 y.o. female with a history of paroxysmal atrial fibrillation on Xarelto, hypertension, hyperlipidemia, GERD, IBS, and arthritis who is followed by Dr. Stanford Breed and presents today for pre-op evaluation.  Patient previously followed by Dr. Einar Gip and is now followed by Dr. Stanford Breed. She was seen in the ED in 08/2017 with atrial fibrillation but spontaneous converted back to sinus rhythm. She was started on Cardizem and Xarelto at that time. Dr. Einar Gip ordered a Myoview and Echo in 09/2017. Myoview showed no evidence of ischemia. Echo showed normal LV function with grade 2 diastolic dysfunction, mild MR, and mild TR. Patient was last seen by Dr. Stanford Breed in 04/2021 at which time she was doing well from a cardiac standpoint.  Patient presents today for pre-op evaluation for upcoming left knee replacement. ***  Pre-Op Evaluation Patient has upcoming left total knee replacement planned for 03/18/2022. Patient is doing well from a cardiac standpoint with no angina, acute CHF symptoms, palpitations, syncope. She is able to complete >4.0 METS with problems. Therefore, based on ACC/AHA guidelines, patient would be at acceptable risk for the planned procedure without further cardiovascular testing. Per Pharmacy and office protocol, patient can hold Xarelto for 3 days prior to procedure. Please restart this as soon as safely possible post-operatively. I will route this recommendation to the requesting party via Epic fax function.   Paroxysmal Atrial Fibrillation Maintaining sinus rhythm.  - Continue Cardizem '30mg'$  every 4 hours as needed for palpitations. - Continue Xarelto '20mg'$  daily.  Hypertension BP  well controlled. - Continue HCTZ 12.'5mg'$  every other day. - Also on PRN Cardizem as above.  Hyperlipidemia History of hyperlipidemia but not on any medications. Intolerant to statins and Zetia in the past. ***  Past Medical History:  Diagnosis Date   Allergy    occasionally    Anxiety    closterphobia   Arthritis    knees and thumbs    Cataract    bilaterally and removed   Colon polyps    Eye pressure    elevated eye pressure because of thickened cornea but monitoring for glaucona   GERD (gastroesophageal reflux disease)    Hyperlipidemia    Hypertension    IBS (irritable bowel syndrome)    Osteopenia    Other fracture of right great toe, subsequent encounter for fracture with routine healing    Sciatica     Past Surgical History:  Procedure Laterality Date   APPENDECTOMY     COLONOSCOPY     DILATION AND CURETTAGE OF UTERUS     x 6    ENDOMETRIAL ABLATION     EXPLORATORY LAPAROTOMY     POLYPECTOMY     TONSILLECTOMY      Current Medications: No outpatient medications have been marked as taking for the 01/02/22 encounter (Appointment) with Darreld Mclean, PA-C.     Allergies:   Caltrate [calcium carbonate], Fosamax [alendronate sodium], Other, Statins, Timolol, Welchol [colesevelam hcl], and Zetia [ezetimibe]   Social History   Socioeconomic History   Marital status: Widowed    Spouse name: Not on file   Number of children: Not on file   Years of education: Not on  file   Highest education level: Not on file  Occupational History   Not on file  Tobacco Use   Smoking status: Never   Smokeless tobacco: Never  Substance and Sexual Activity   Alcohol use: No   Drug use: No   Sexual activity: Not on file  Other Topics Concern   Not on file  Social History Narrative   Not on file   Social Determinants of Health   Financial Resource Strain: Not on file  Food Insecurity: Not on file  Transportation Needs: Not on file  Physical Activity: Not on file   Stress: Not on file  Social Connections: Not on file     Family History: The patient's family history includes Cancer - Lung in her brother; Colon polyps in her brother; Dementia in her brother and mother. There is no history of Colon cancer, Rectal cancer, or Stomach cancer.  ROS:   Please see the history of present illness.     EKGs/Labs/Other Studies Reviewed:    The following studies were reviewed today:  Myoview 09/29/2017: Low risk. No evidence of ischemia. _______________  Echocardiogram 10/07/2017: Findings: - LV normal in size. LVEF 67%. Mild LVH. Grade 2 diastolic dysfunction. - Mild calcification of mitral valve annulus with mild MR. - Mild TR. No evidence of pulmonary hypertension.  EKG:  EKG ordered today. EKG personally reviewed and demonstrates ***.  Recent Labs: 04/26/2021: BUN 16; Creatinine, Ser 0.75; Hemoglobin 14.1; Platelets 176; Potassium 4.3; Sodium 143  Recent Lipid Panel No results found for: CHOL, TRIG, HDL, CHOLHDL, VLDL, LDLCALC, LDLDIRECT  Physical Exam:    Vital Signs: There were no vitals taken for this visit.    Wt Readings from Last 3 Encounters:  04/26/21 221 lb 3.2 oz (100.3 kg)  04/06/20 207 lb (93.9 kg)  08/19/19 225 lb 9.6 oz (102.3 kg)     General: 81 y.o. female in no acute distress. HEENT: Normocephalic and atraumatic. Sclera clear. EOMs intact. Neck: Supple. No carotid bruits. No JVD. Heart: *** RRR. Distinct S1 and S2. No murmurs, gallops, or rubs. Radial and distal pedal pulses 2+ and equal bilaterally. Lungs: No increased work of breathing. Clear to ausculation bilaterally. No wheezes, rhonchi, or rales.  Abdomen: Soft, non-distended, and non-tender to palpation. Bowel sounds present in all 4 quadrants.  MSK: Normal strength and tone for age. *** Extremities: No lower extremity edema.    Skin: Warm and dry. Neuro: Alert and oriented x3. No focal deficits. Psych: Normal affect. Responds appropriately.   Assessment:     No diagnosis found.  Plan:     Disposition: Follow up in ***   Medication Adjustments/Labs and Tests Ordered: Current medicines are reviewed at length with the patient today.  Concerns regarding medicines are outlined above.  No orders of the defined types were placed in this encounter.  No orders of the defined types were placed in this encounter.   There are no Patient Instructions on file for this visit.   Signed, Darreld Mclean, PA-C  12/25/2021 2:37 PM    [facility]

## 2022-01-02 ENCOUNTER — Ambulatory Visit: Payer: PPO | Admitting: Student

## 2022-01-16 DIAGNOSIS — H40013 Open angle with borderline findings, low risk, bilateral: Secondary | ICD-10-CM | POA: Diagnosis not present

## 2022-01-16 DIAGNOSIS — H18832 Recurrent erosion of cornea, left eye: Secondary | ICD-10-CM | POA: Diagnosis not present

## 2022-01-20 ENCOUNTER — Emergency Department (HOSPITAL_BASED_OUTPATIENT_CLINIC_OR_DEPARTMENT_OTHER): Payer: PPO

## 2022-01-20 ENCOUNTER — Other Ambulatory Visit: Payer: Self-pay

## 2022-01-20 ENCOUNTER — Emergency Department (HOSPITAL_BASED_OUTPATIENT_CLINIC_OR_DEPARTMENT_OTHER)
Admission: EM | Admit: 2022-01-20 | Discharge: 2022-01-20 | Disposition: A | Discharge status: Home / Self Care | Payer: PPO | Attending: Emergency Medicine | Admitting: Emergency Medicine

## 2022-01-20 ENCOUNTER — Encounter (HOSPITAL_BASED_OUTPATIENT_CLINIC_OR_DEPARTMENT_OTHER): Payer: Self-pay | Admitting: Emergency Medicine

## 2022-01-20 DIAGNOSIS — M79604 Pain in right leg: Secondary | ICD-10-CM | POA: Insufficient documentation

## 2022-01-20 DIAGNOSIS — M25561 Pain in right knee: Secondary | ICD-10-CM | POA: Diagnosis not present

## 2022-01-20 DIAGNOSIS — I4891 Unspecified atrial fibrillation: Secondary | ICD-10-CM | POA: Insufficient documentation

## 2022-01-20 DIAGNOSIS — Z96652 Presence of left artificial knee joint: Secondary | ICD-10-CM | POA: Insufficient documentation

## 2022-01-20 DIAGNOSIS — R600 Localized edema: Secondary | ICD-10-CM | POA: Diagnosis not present

## 2022-01-20 DIAGNOSIS — Z7901 Long term (current) use of anticoagulants: Secondary | ICD-10-CM | POA: Diagnosis not present

## 2022-01-20 MED ORDER — ACETAMINOPHEN 325 MG PO TABS
650.0000 mg | ORAL_TABLET | Freq: Once | ORAL | Status: AC
  Administered 2022-01-20: 650 mg via ORAL
  Filled 2022-01-20: qty 2

## 2022-01-20 NOTE — Discharge Instructions (Signed)
It was a pleasure to take care of you today!  Your ultrasound was negative for blood clot to the right leg.  Call your orthopedist, Dr. Wynelle Link tomorrow to set up a follow-up appointment during today's ED visit.  You may place warm compress to the area for up to 15 minutes at a time.  Ensure to place a barrier between your skin and the warm compress.  You may take over-the-counter 500 mg Tylenol every 6 hours as needed for pain.  Gentle massaging to the area may help as well.  As well as gentle stretching.  Follow-up with your primary care provider as needed.  Return to the emergency primary for experiencing increasing/worsening knee pain, redness, swelling, inability to walk, worsening symptoms.

## 2022-01-20 NOTE — ED Notes (Signed)
Called and no answer. Front desk said she stepped out to her trck to get something

## 2022-01-20 NOTE — ED Notes (Signed)
Ambulatory to bathroom. Gait steady. No signs of distress.  °

## 2022-01-20 NOTE — ED Provider Notes (Addendum)
[place] EMERGENCY DEPT Provider Note   CSN: [id number] Arrival date & time: 01/20/22  1348     History  Chief Complaint  Patient presents with   Knee Pain    Amber Sims is a 81 y.o. female who presents to the ED complaining of right knee pain onset 1 week.  Patient notes that she has been working out with a trainer since March and doing exercises on the elliptical. She was evaluated by orthopedist, Dr. Wynelle Link in February for a left knee replacement.  She originally had a surgery scheduled for July however after discussion with her primary care provider she decided to cancel the surgery and opted for improving her exercise regimen prior to surgery.  Patient is currently on Xarelto for A-fib.  She ambulates with a cane at baseline.  No meds tried prior to arrival.  Denies recent surgery, immobilization.  Patient voices concerns today for a blood clot of the leg.  Denies fever, chills, redness, wound, gait problem, rash.  Denies recent fall, injury, trauma.  The history is provided by the patient. No language interpreter was used.      Home Medications Prior to Admission medications   Medication Sig Start Date End Date Taking? Authorizing Provider  acetaminophen (TYLENOL 8 HOUR ARTHRITIS PAIN) 650 MG CR tablet Take 650 mg by mouth every 8 (eight) hours as needed for pain.    [provider]  Carboxymethylcellulose Sod PF 0.5 % SOLN 1 drop 3 (three) times daily as needed.    [provider]  cholecalciferol (VITAMIN D) 1000 UNITS tablet Take 1,000 Units by mouth daily.     [provider]  diltiazem (CARDIZEM) 30 MG tablet TAKE 1 TABELT EVERY 4 HOURS AS NEEDED FOR AFIB HEART RATE OVER 100. 04/26/21   Lelon Perla, MD  esomeprazole (NEXIUM) 40 MG capsule Take by mouth.    [provider]  Famotidine (PEPCID PO) Take 1 tablet by mouth daily as needed.     [provider]  hydrochlorothiazide (HYDRODIURIL) 12.5 MG  tablet Take 12.5 mg by mouth every other day.    [provider]  LORazepam (ATIVAN) 0.5 MG tablet Take by mouth.    [provider]  potassium chloride (KLOR-CON) 20 MEQ packet Take 20 mEq by mouth daily.    [provider]  sertraline (ZOLOFT) 50 MG tablet Take 50 mg by mouth daily. 03/09/20   [provider]  XARELTO 20 MG TABS tablet Take 1 tablet (20 mg total) by mouth daily. 04/27/21   Lelon Perla, MD      Allergies    Caltrate [calcium carbonate], Fosamax [alendronate sodium], Other, Statins, Timolol, Welchol [colesevelam hcl], and Zetia [ezetimibe]    Review of Systems   Review of Systems  Constitutional:  Negative for chills and fever.  Musculoskeletal:  Positive for arthralgias and joint swelling. Negative for gait problem.  Skin:  Negative for color change, rash and wound.  All other systems reviewed and are negative.  Physical Exam Updated Vital Signs BP (!) 146/64 (BP Location: Left Arm)   Pulse 70   Temp 97.8 F (36.6 C) (Oral)   Resp 18   Ht '5\' 3"'$  (1.6 m)   Wt 99.8 kg   SpO2 98%   BMI 38.97 kg/m  Physical Exam Vitals and nursing note reviewed.  Constitutional:      General: She is not in acute distress.    Appearance: Normal appearance.  Eyes:     General:  No scleral icterus.    Extraocular Movements: Extraocular movements intact.  Cardiovascular:     Rate and Rhythm: Normal rate.  Pulmonary:     Effort: Pulmonary effort is normal. No respiratory distress.  Musculoskeletal:     Cervical back: Neck supple.     Comments: Mild tenderness to palpation to posterior aspect of the right knee.  No increased warmth noted to joint.  No obvious deformity, effusion, erythema, or swelling.  Full active range of motion of right knee. Patient able to ambulate without difficulty or assistance. No antalgic gait.  No tenderness to palpation to C, T, L, S spine. Strength and sensation intact to bilateral lower extremities.   Skin:     General: Skin is warm and dry.     Findings: No bruising, erythema or rash.  Neurological:     Mental Status: She is alert.  Psychiatric:        Behavior: Behavior normal.    ED Results / Procedures / Treatments   Labs (all labs ordered are listed, but only abnormal results are displayed) Labs Reviewed - No data to display  EKG None  Radiology US Venous Img Lower Right (DVT Study)  Result Date: 01/20/2022 CLINICAL DATA:  Right posterior knee pain EXAM: RIGHT LOWER EXTREMITY VENOUS DOPPLER ULTRASOUND TECHNIQUE: Gray-scale sonography with compression, as well as color and duplex ultrasound, were performed to evaluate the deep venous system(s) from the level of the common femoral vein through the popliteal and proximal calf veins. COMPARISON:  None Available. FINDINGS: VENOUS Normal compressibility of the common femoral, superficial femoral, and popliteal veins, as well as the visualized calf veins. Visualized portions of profunda femoral vein and great saphenous vein unremarkable. No filling defects to suggest DVT on grayscale or color Doppler imaging. Doppler waveforms show normal direction of venous flow, normal respiratory plasticity and response to augmentation. Limited views of the contralateral common femoral vein are unremarkable. OTHER Small Baker's cyst measures 4.5 x 2.4 x 1.2 cm. Edema within the subcutaneous soft tissues in the right lower leg. Limitations: none IMPRESSION: No evidence of right lower extremity DVT. Electronically Signed   By: Rolm Baptise M.D.   On: 01/20/2022 18:33    Procedures Procedures    Medications Ordered in ED Medications  acetaminophen (TYLENOL) tablet 650 mg (650 mg Oral Given 01/20/22 1757)    ED Course/ Medical Decision Making/ A&P                            Medical Decision Making Risk OTC drugs.   Pt presents with right knee pain onset 1 week.  Patient notes she has been increasing her exercise regimen recently prior to the onset of her  symptoms. Denies recent fall, injury, trauma. Vital signs, stable, patient afebrile. On exam, pt with Mild tenderness to palpation to posterior aspect of the right knee.  No increased warmth noted to joint.  No obvious deformity, effusion, erythema, or swelling.  Full active range of motion of right knee. Patient able to ambulate without difficulty or assistance. No antalgic gait.  No tenderness to palpation to C, T, L, S spine. Strength and sensation intact to bilateral lower extremities. No acute cardiovascular, respiratory, abdominal exam findings. Differential diagnosis includes fracture, dislocation, strain, septic arthritis, DVT.    Imaging: I ordered imaging studies including DVT ultrasound study I independently visualized and interpreted imaging which showed: No evidence of right lower extremity DVT I agree with the radiologist interpretation  Medications:  I ordered medication including Tylenol for symptom management Reevaluation of the patient after these medicines and interventions, I reevaluated the patient and found that they have improved I have reviewed the patients home medicines and have made adjustments as needed   Disposition: Presentation suspicious for strain of right knee secondary to increasing her exercise regimen.  Doubt DVT, fracture, dislocation, septic arthritis at this time. After consideration of the diagnostic results and the patients response to treatment, I feel that the patient would benefit from Discharge home.  Discussed with patient's importance of maintaining follow-up with her orthopedist regarding today's ED visit.  Also discussed she may follow-up with provider as needed.  Supportive care measures and strict return precautions discussed with patient at bedside. Pt acknowledges and verbalizes understanding. Pt appears safe for discharge. Follow up as indicated in discharge paperwork.    This chart was dictated using voice recognition software, Dragon. Despite  the best efforts of this provider to proofread and correct errors, errors may still occur which can change documentation meaning.  Final Clinical Impression(s) / ED Diagnoses Final diagnoses:  Acute pain of right knee    Rx / DC Orders ED Discharge Orders     None         Najat Olazabal A, PA-C 01/20/22 2344    Tennelle Taflinger A, PA-C 01/20/22 2345    Sherwood Gambler, MD 01/26/22 [phone]

## 2022-01-20 NOTE — ED Triage Notes (Signed)
Pt c/o right knee pain x 1 week. Pt reports leg is warm to touch.

## 2022-01-25 DIAGNOSIS — M1712 Unilateral primary osteoarthritis, left knee: Secondary | ICD-10-CM | POA: Diagnosis not present

## 2022-01-25 DIAGNOSIS — M1711 Unilateral primary osteoarthritis, right knee: Secondary | ICD-10-CM | POA: Diagnosis not present

## 2022-01-25 DIAGNOSIS — M25561 Pain in right knee: Secondary | ICD-10-CM | POA: Diagnosis not present

## 2022-01-25 DIAGNOSIS — M170 Bilateral primary osteoarthritis of knee: Secondary | ICD-10-CM | POA: Diagnosis not present

## 2022-03-07 ENCOUNTER — Other Ambulatory Visit (HOSPITAL_COMMUNITY): Payer: PPO

## 2022-03-18 ENCOUNTER — Ambulatory Visit: Admit: 2022-03-18 | Payer: PPO | Admitting: Orthopedic Surgery

## 2022-03-18 SURGERY — ARTHROPLASTY, KNEE, TOTAL
Anesthesia: Choice | Site: Knee | Laterality: Left

## 2022-03-26 DIAGNOSIS — H18831 Recurrent erosion of cornea, right eye: Secondary | ICD-10-CM | POA: Diagnosis not present

## 2022-04-01 DIAGNOSIS — E669 Obesity, unspecified: Secondary | ICD-10-CM | POA: Diagnosis not present

## 2022-04-02 DIAGNOSIS — H18833 Recurrent erosion of cornea, bilateral: Secondary | ICD-10-CM | POA: Diagnosis not present

## 2022-04-10 DIAGNOSIS — K219 Gastro-esophageal reflux disease without esophagitis: Secondary | ICD-10-CM | POA: Diagnosis not present

## 2022-04-10 DIAGNOSIS — I10 Essential (primary) hypertension: Secondary | ICD-10-CM | POA: Diagnosis not present

## 2022-04-10 DIAGNOSIS — E7800 Pure hypercholesterolemia, unspecified: Secondary | ICD-10-CM | POA: Diagnosis not present

## 2022-04-10 DIAGNOSIS — F325 Major depressive disorder, single episode, in full remission: Secondary | ICD-10-CM | POA: Diagnosis not present

## 2022-04-10 DIAGNOSIS — I480 Paroxysmal atrial fibrillation: Secondary | ICD-10-CM | POA: Diagnosis not present

## 2022-04-10 DIAGNOSIS — M170 Bilateral primary osteoarthritis of knee: Secondary | ICD-10-CM | POA: Diagnosis not present

## 2022-04-26 ENCOUNTER — Ambulatory Visit: Payer: PPO | Admitting: Cardiology

## 2022-04-29 NOTE — Progress Notes (Signed)
HPI: FU atrial fibrillation.  Patient seen in the emergency room December 2018 with atrial fibrillation.  She converted to sinus rhythm spontaneously.  Patient treated with Cardizem and Xarelto.  TSH was normal.  Seen by Dr. Einar Gip and nuclear study January 2019 showed no ischemia and echocardiogram showed normal ejection fraction and mild mitral regurgitation. Since last seen the patient has dyspnea with more extreme activities but not with routine activities. It is relieved with rest. It is not associated with chest pain. There is no orthopnea, PND or pedal edema. There is no syncope or palpitations. There is no exertional chest pain.   Current Outpatient Medications  Medication Sig Dispense Refill   acetaminophen (TYLENOL 8 HOUR ARTHRITIS PAIN) 650 MG CR tablet Take 650 mg by mouth every 8 (eight) hours as needed for pain.     Carboxymethylcellulose Sod PF 0.5 % SOLN 1 drop 3 (three) times daily as needed.     cholecalciferol (VITAMIN D) 1000 UNITS tablet Take 1,000 Units by mouth daily.      diltiazem (CARDIZEM) 30 MG tablet TAKE 1 TABELT EVERY 4 HOURS AS NEEDED FOR AFIB HEART RATE OVER 100. 15 tablet 6   Famotidine (PEPCID PO) Take 1 tablet by mouth daily as needed.      hydrochlorothiazide (HYDRODIURIL) 12.5 MG tablet Take 12.5 mg by mouth every other day.     Lidocaine-Menthol (ICY HOT MAX LIDOCAINE) 4-1 % CREA      LORazepam (ATIVAN) 0.5 MG tablet Take by mouth.     Polyethyl Glycol-Propyl Glycol (GENTEAL TEARS SEVERE DAY/NIGHT) 0.4-0.3 % GEL ophthalmic gel      potassium chloride (KLOR-CON) 20 MEQ packet Take 20 mEq by mouth daily.     sertraline (ZOLOFT) 50 MG tablet Take 50 mg by mouth daily.     sodium chloride (MURO 128) 5 % ophthalmic solution      XARELTO 20 MG TABS tablet Take 1 tablet (20 mg total) by mouth daily. 90 tablet 3   esomeprazole (NEXIUM) 40 MG capsule Take by mouth. (Patient not taking: Reported on 05/13/2022)     No current facility-administered medications for  this visit.     Past Medical History:  Diagnosis Date   Allergy    occasionally    Anxiety    closterphobia   Arthritis    knees and thumbs    Cataract    bilaterally and removed   Colon polyps    Eye pressure    elevated eye pressure because of thickened cornea but monitoring for glaucona   GERD (gastroesophageal reflux disease)    Hyperlipidemia    Hypertension    IBS (irritable bowel syndrome)    Osteopenia    Other fracture of right great toe, subsequent encounter for fracture with routine healing    Sciatica     Past Surgical History:  Procedure Laterality Date   APPENDECTOMY     COLONOSCOPY     DILATION AND CURETTAGE OF UTERUS     x 6    ENDOMETRIAL ABLATION     EXPLORATORY LAPAROTOMY     POLYPECTOMY     TONSILLECTOMY      Social History   Socioeconomic History   Marital status: Widowed    Spouse name: Not on file   Number of children: Not on file   Years of education: Not on file   Highest education level: Not on file  Occupational History   Not on file  Tobacco Use   Smoking status:  Never   Smokeless tobacco: Never  Vaping Use   Vaping Use: Never used  Substance and Sexual Activity   Alcohol use: No   Drug use: No   Sexual activity: Not on file  Other Topics Concern   Not on file  Social History Narrative   Not on file   Social Determinants of Health   Financial Resource Strain: Not on file  Food Insecurity: Not on file  Transportation Needs: Not on file  Physical Activity: Not on file  Stress: Not on file  Social Connections: Not on file  Intimate Partner Violence: Not on file    Family History  Problem Relation Age of Onset   Dementia Mother    Cancer - Lung Brother    Dementia Brother    Colon polyps Brother    Colon cancer Neg Hx    Rectal cancer Neg Hx    Stomach cancer Neg Hx     ROS: Some knee arthralgias but no fevers or chills, productive cough, hemoptysis, dysphasia, odynophagia, melena, hematochezia, dysuria,  hematuria, rash, seizure activity, orthopnea, PND, pedal edema, claudication. Remaining systems are negative.  Physical Exam: Well-developed well-nourished in no acute distress.  Skin is warm and dry.  HEENT is normal.  Neck is supple.  Chest is clear to auscultation with normal expansion.  Cardiovascular exam is regular rate and rhythm.  Abdominal exam nontender or distended. No masses palpated. Extremities show trace edema. neuro grossly intact  ECG-normal sinus rhythm at a rate of 62, no ST changes.  Personally reviewed  A/P  1 PAF-pt remains in sinus; continue xarelto and cardizem as needed.  Check hemoglobin and renal function.  2 hypertension-patient's blood pressure is controlled.  Continue present medical regimen.  3 palpitations-she has not had recurrent symptoms.  4 hyperlipidemia-managed by primary care.  Kirk Ruths, MD

## 2022-05-02 DIAGNOSIS — E669 Obesity, unspecified: Secondary | ICD-10-CM | POA: Diagnosis not present

## 2022-05-13 ENCOUNTER — Ambulatory Visit: Payer: PPO | Attending: Cardiology | Admitting: Cardiology

## 2022-05-13 ENCOUNTER — Encounter: Payer: Self-pay | Admitting: Cardiology

## 2022-05-13 VITALS — BP 126/82 | HR 62 | Ht 62.0 in | Wt 216.2 lb

## 2022-05-13 DIAGNOSIS — I10 Essential (primary) hypertension: Secondary | ICD-10-CM | POA: Diagnosis not present

## 2022-05-13 DIAGNOSIS — E7800 Pure hypercholesterolemia, unspecified: Secondary | ICD-10-CM | POA: Diagnosis not present

## 2022-05-13 DIAGNOSIS — I480 Paroxysmal atrial fibrillation: Secondary | ICD-10-CM

## 2022-05-13 DIAGNOSIS — R002 Palpitations: Secondary | ICD-10-CM

## 2022-05-13 NOTE — Patient Instructions (Signed)
  Follow-Up: At [facility], you and your health needs are our priority.  As part of our continuing mission to provide you with exceptional heart care, we have created designated Provider Care Teams.  These Care Teams include your primary Cardiologist (physician) and Advanced Practice Providers (APPs -  Physician Assistants and Nurse Practitioners) who all work together to provide you with the care you need, when you need it.  We recommend signing up for the patient portal called "MyChart".  Sign up information is provided on this After Visit Summary.  MyChart is used to connect with patients for Virtual Visits (Telemedicine).  Patients are able to view lab/test results, encounter notes, upcoming appointments, etc.  Non-urgent messages can be sent to your provider as well.   To learn more about what you can do with MyChart, go to [email or web address].    Your next appointment:   12 month(s)  The format for your next appointment:   In Person  Provider:   Brian Crenshaw, MD   

## 2022-05-14 LAB — CBC
Hematocrit: 43.2 % (ref 34.0–46.6)
Hemoglobin: 14.7 g/dL (ref 11.1–15.9)
MCH: 30.7 pg (ref 26.6–33.0)
MCHC: 34 g/dL (ref 31.5–35.7)
MCV: 90 fL (ref 79–97)
Platelets: 195 10*3/uL (ref 150–450)
RBC: 4.79 x10E6/uL (ref 3.77–5.28)
RDW: 13.3 % (ref 11.7–15.4)
WBC: 6.7 10*3/uL (ref 3.4–10.8)

## 2022-05-14 LAB — BASIC METABOLIC PANEL
BUN/Creatinine Ratio: 22 (ref 12–28)
BUN: 17 mg/dL (ref 8–27)
CO2: 24 mmol/L (ref 20–29)
Calcium: 10.4 mg/dL — ABNORMAL HIGH (ref 8.7–10.3)
Chloride: 104 mmol/L (ref 96–106)
Creatinine, Ser: 0.76 mg/dL (ref 0.57–1.00)
Glucose: 78 mg/dL (ref 70–99)
Potassium: 4.7 mmol/L (ref 3.5–5.2)
Sodium: 142 mmol/L (ref 134–144)
eGFR: 79 mL/min/{1.73_m2} (ref >59)

## 2022-05-14 NOTE — Addendum Note (Signed)
Addended by: Ellwood Handler on: 8/89/1694 02:31 PM   Modules accepted: Orders

## 2022-05-24 ENCOUNTER — Other Ambulatory Visit: Payer: Self-pay | Admitting: Cardiology

## 2022-05-24 DIAGNOSIS — I480 Paroxysmal atrial fibrillation: Secondary | ICD-10-CM

## 2022-05-24 NOTE — Telephone Encounter (Signed)
Xarelto '20mg'$  refill request received. Pt is 81 years old, weight-98.1kg, Crea-0.76 on 05/13/2022, last seen by Dr. Stanford Breed on 05/13/2022, Diagnosis-Afib, CrCl-91.43 mL/min; Dose is appropriate based on dosing criteria. Will send in refill to requested pharmacy.

## 2022-06-01 DIAGNOSIS — E669 Obesity, unspecified: Secondary | ICD-10-CM | POA: Diagnosis not present

## 2022-06-04 DIAGNOSIS — E041 Nontoxic single thyroid nodule: Secondary | ICD-10-CM | POA: Diagnosis not present

## 2022-06-04 DIAGNOSIS — Z23 Encounter for immunization: Secondary | ICD-10-CM | POA: Diagnosis not present

## 2022-06-04 DIAGNOSIS — I10 Essential (primary) hypertension: Secondary | ICD-10-CM | POA: Diagnosis not present

## 2022-06-05 ENCOUNTER — Other Ambulatory Visit: Payer: Self-pay | Admitting: Family Medicine

## 2022-06-05 DIAGNOSIS — E041 Nontoxic single thyroid nodule: Secondary | ICD-10-CM

## 2022-06-07 DIAGNOSIS — Z1231 Encounter for screening mammogram for malignant neoplasm of breast: Secondary | ICD-10-CM | POA: Diagnosis not present

## 2022-06-10 ENCOUNTER — Ambulatory Visit
Admission: RE | Admit: 2022-06-10 | Discharge: 2022-06-10 | Disposition: A | Discharge status: Home / Self Care | Payer: PPO | Source: Ambulatory Visit | Attending: Family Medicine | Admitting: Family Medicine

## 2022-06-10 DIAGNOSIS — E042 Nontoxic multinodular goiter: Secondary | ICD-10-CM | POA: Diagnosis not present

## 2022-06-10 DIAGNOSIS — E010 Iodine-deficiency related diffuse (endemic) goiter: Secondary | ICD-10-CM | POA: Diagnosis not present

## 2022-06-10 DIAGNOSIS — E041 Nontoxic single thyroid nodule: Secondary | ICD-10-CM

## 2022-06-14 DIAGNOSIS — R922 Inconclusive mammogram: Secondary | ICD-10-CM | POA: Diagnosis not present

## 2022-06-14 DIAGNOSIS — R921 Mammographic calcification found on diagnostic imaging of breast: Secondary | ICD-10-CM | POA: Diagnosis not present

## 2022-07-02 DIAGNOSIS — E669 Obesity, unspecified: Secondary | ICD-10-CM | POA: Diagnosis not present

## 2022-08-01 DIAGNOSIS — E669 Obesity, unspecified: Secondary | ICD-10-CM | POA: Diagnosis not present

## 2022-08-13 DIAGNOSIS — Z01419 Encounter for gynecological examination (general) (routine) without abnormal findings: Secondary | ICD-10-CM | POA: Diagnosis not present

## 2022-08-13 DIAGNOSIS — Z6839 Body mass index (BMI) 39.0-39.9, adult: Secondary | ICD-10-CM | POA: Diagnosis not present

## 2022-08-23 DIAGNOSIS — I480 Paroxysmal atrial fibrillation: Secondary | ICD-10-CM | POA: Diagnosis not present

## 2022-08-23 DIAGNOSIS — Z23 Encounter for immunization: Secondary | ICD-10-CM | POA: Diagnosis not present

## 2022-08-23 DIAGNOSIS — E7800 Pure hypercholesterolemia, unspecified: Secondary | ICD-10-CM | POA: Diagnosis not present

## 2022-08-23 DIAGNOSIS — E559 Vitamin D deficiency, unspecified: Secondary | ICD-10-CM | POA: Diagnosis not present

## 2022-08-23 DIAGNOSIS — E041 Nontoxic single thyroid nodule: Secondary | ICD-10-CM | POA: Diagnosis not present

## 2022-08-23 DIAGNOSIS — I10 Essential (primary) hypertension: Secondary | ICD-10-CM | POA: Diagnosis not present

## 2022-08-23 DIAGNOSIS — F419 Anxiety disorder, unspecified: Secondary | ICD-10-CM | POA: Diagnosis not present

## 2022-08-23 DIAGNOSIS — D329 Benign neoplasm of meninges, unspecified: Secondary | ICD-10-CM | POA: Diagnosis not present

## 2022-08-23 DIAGNOSIS — Z0000 Encounter for general adult medical examination without abnormal findings: Secondary | ICD-10-CM | POA: Diagnosis not present

## 2022-09-30 DIAGNOSIS — E669 Obesity, unspecified: Secondary | ICD-10-CM | POA: Diagnosis not present

## 2022-09-30 DIAGNOSIS — Z6839 Body mass index (BMI) 39.0-39.9, adult: Secondary | ICD-10-CM | POA: Diagnosis not present

## 2022-10-08 DIAGNOSIS — H40053 Ocular hypertension, bilateral: Secondary | ICD-10-CM | POA: Diagnosis not present

## 2022-10-08 DIAGNOSIS — H52203 Unspecified astigmatism, bilateral: Secondary | ICD-10-CM | POA: Diagnosis not present

## 2022-10-08 DIAGNOSIS — H40013 Open angle with borderline findings, low risk, bilateral: Secondary | ICD-10-CM | POA: Diagnosis not present

## 2022-10-08 DIAGNOSIS — Z961 Presence of intraocular lens: Secondary | ICD-10-CM | POA: Diagnosis not present

## 2022-10-09 DIAGNOSIS — B351 Tinea unguium: Secondary | ICD-10-CM | POA: Diagnosis not present

## 2022-10-09 DIAGNOSIS — L570 Actinic keratosis: Secondary | ICD-10-CM | POA: Diagnosis not present

## 2022-10-09 DIAGNOSIS — L821 Other seborrheic keratosis: Secondary | ICD-10-CM | POA: Diagnosis not present

## 2022-10-10 ENCOUNTER — Encounter (HOSPITAL_COMMUNITY): Payer: Self-pay | Admitting: *Deleted

## 2022-11-29 ENCOUNTER — Other Ambulatory Visit: Payer: Self-pay | Admitting: Cardiology

## 2022-11-29 DIAGNOSIS — I480 Paroxysmal atrial fibrillation: Secondary | ICD-10-CM

## 2022-11-29 NOTE — Telephone Encounter (Signed)
Prescription refill request for Xarelto received.   Indication: afib  Last office visit: Crenshaw, 05/13/2022 Weight: 98.1 kg  Age: 82 yo  Scr: 0.76, 05/13/2022 CrCl: 91 ml/min   Refill sent.

## 2023-05-08 NOTE — Progress Notes (Signed)
HPI: FU atrial fibrillation.  Patient seen in the emergency room December 2018 with atrial fibrillation.  She converted to sinus rhythm spontaneously.  Patient treated with Cardizem and Xarelto.  TSH was normal.  Seen by Dr. Jacinto Halim and nuclear study January 2019 showed no ischemia and echocardiogram showed normal ejection fraction and mild mitral regurgitation. Since last seen the patient has dyspnea with more extreme activities but not with routine activities. It is relieved with rest. It is not associated with chest pain. There is no orthopnea, PND or pedal edema. There is no syncope or palpitations. There is no exertional chest pain.   Current Outpatient Medications  Medication Sig Dispense Refill   acetaminophen (TYLENOL 8 HOUR ARTHRITIS PAIN) 650 MG CR tablet Take 650 mg by mouth every 8 (eight) hours as needed for pain.     Carboxymethylcellulose Sod PF (REFRESH PLUS) 0.5 % SOLN daily at 6 (six) AM.     cholecalciferol (VITAMIN D) 1000 UNITS tablet Take 1,000 Units by mouth daily.      diltiazem (CARDIZEM) 30 MG tablet TAKE 1 TABELT EVERY 4 HOURS AS NEEDED FOR AFIB HEART RATE OVER 100. 15 tablet 6   Famotidine (PEPCID PO) Take 1 tablet by mouth daily as needed.      hydrochlorothiazide (HYDRODIURIL) 12.5 MG tablet Take 12.5 mg by mouth daily.     Lidocaine-Menthol (ICY HOT MAX LIDOCAINE) 4-1 % CREA      LORazepam (ATIVAN) 0.5 MG tablet Take by mouth.     Polyethyl Glycol-Propyl Glycol (GENTEAL TEARS SEVERE DAY/NIGHT) 0.4-0.3 % GEL ophthalmic gel      polyethylene glycol powder (GLYCOLAX/MIRALAX) 17 GM/SCOOP powder Take by mouth as needed.     potassium chloride (KLOR-CON) 20 MEQ packet Take 20 mEq by mouth daily.     Psyllium (GERI-MUCIL) 68 % POWD Take by mouth daily at 6 (six) AM.     sertraline (ZOLOFT) 50 MG tablet Take 50 mg by mouth daily.     sodium chloride (MURO 128) 5 % ophthalmic solution      XARELTO 20 MG TABS tablet TAKE ONE TABLET DAILY WITH SUPPER 90 tablet 1   No  current facility-administered medications for this visit.     Past Medical History:  Diagnosis Date   Allergy    occasionally    Anxiety    closterphobia   Arthritis    knees and thumbs    Cataract    bilaterally and removed   Colon polyps    Eye pressure    elevated eye pressure because of thickened cornea but monitoring for glaucona   GERD (gastroesophageal reflux disease)    Hyperlipidemia    Hypertension    IBS (irritable bowel syndrome)    Osteopenia    Other fracture of right great toe, subsequent encounter for fracture with routine healing    Sciatica     Past Surgical History:  Procedure Laterality Date   APPENDECTOMY     COLONOSCOPY     DILATION AND CURETTAGE OF UTERUS     x 6    ENDOMETRIAL ABLATION     EXPLORATORY LAPAROTOMY     POLYPECTOMY     TONSILLECTOMY      Social History   Socioeconomic History   Marital status: Widowed    Spouse name: Not on file   Number of children: Not on file   Years of education: Not on file   Highest education level: Not on file  Occupational History   Not  on file  Tobacco Use   Smoking status: Never   Smokeless tobacco: Never  Vaping Use   Vaping status: Never Used  Substance and Sexual Activity   Alcohol use: No   Drug use: No   Sexual activity: Not on file  Other Topics Concern   Not on file  Social History Narrative   Not on file   Social Determinants of Health   Financial Resource Strain: Not on file  Food Insecurity: Not on file  Transportation Needs: Not on file  Physical Activity: Not on file  Stress: Not on file  Social Connections: Not on file  Intimate Partner Violence: Not on file    Family History  Problem Relation Age of Onset   Dementia Mother    Cancer - Lung Brother    Dementia Brother    Colon polyps Brother    Colon cancer Neg Hx    Rectal cancer Neg Hx    Stomach cancer Neg Hx     ROS: no fevers or chills, productive cough, hemoptysis, dysphasia, odynophagia, melena,  hematochezia, dysuria, hematuria, rash, seizure activity, orthopnea, PND, pedal edema, claudication. Remaining systems are negative.  Physical Exam: Well-developed well-nourished in no acute distress.  Skin is warm and dry.  HEENT is normal.  Neck is supple.  Chest is clear to auscultation with normal expansion.  Cardiovascular exam is regular rate and rhythm.  Abdominal exam nontender or distended. No masses palpated. Extremities show trace to 1+ ankle edema. neuro grossly intact  EKG Interpretation Date/Time:  Thursday May 15 2023 09:17:31 EDT Ventricular Rate:  62 PR Interval:  144 QRS Duration:  76 QT Interval:  406 QTC Calculation: 412 R Axis:   76  Text Interpretation: Normal sinus rhythm Normal ECG When compared with ECG of 08-Sep-2017 08:50, Premature ventricular complexes are no longer Present Confirmed by Olga Millers ([id number]) on 05/15/2023 9:18:43 AM    A/P  1 paroxysmal atrial fibrillation-patient is in sinus rhythm today.  Continue Cardizem as needed as well as Xarelto.  Check hemoglobin and renal function.  2 hypertension-blood pressure controlled.  Continue present medications.  Check potassium and renal function.  3 palpitations-patient denies recurrent symptoms.  Will follow.  4 history of hyperlipidemia-will check baseline lipids.  Olga Millers, MD

## 2023-05-15 ENCOUNTER — Ambulatory Visit: Payer: PPO | Attending: Cardiology | Admitting: Cardiology

## 2023-05-15 ENCOUNTER — Encounter: Payer: Self-pay | Admitting: Cardiology

## 2023-05-15 VITALS — BP 130/60 | HR 63 | Ht 62.5 in | Wt 215.2 lb

## 2023-05-15 DIAGNOSIS — I10 Essential (primary) hypertension: Secondary | ICD-10-CM | POA: Diagnosis not present

## 2023-05-15 DIAGNOSIS — R002 Palpitations: Secondary | ICD-10-CM

## 2023-05-15 DIAGNOSIS — E7800 Pure hypercholesterolemia, unspecified: Secondary | ICD-10-CM | POA: Diagnosis not present

## 2023-05-15 DIAGNOSIS — I480 Paroxysmal atrial fibrillation: Secondary | ICD-10-CM | POA: Diagnosis not present

## 2023-05-15 NOTE — Patient Instructions (Signed)

## 2023-05-16 LAB — BASIC METABOLIC PANEL
BUN/Creatinine Ratio: 25 (ref 12–28)
BUN: 20 mg/dL (ref 8–27)
CO2: 27 mmol/L (ref 20–29)
Calcium: 10.2 mg/dL (ref 8.7–10.3)
Chloride: 104 mmol/L (ref 96–106)
Creatinine, Ser: 0.79 mg/dL (ref 0.57–1.00)
Glucose: 84 mg/dL (ref 70–99)
Potassium: 4.2 mmol/L (ref 3.5–5.2)
Sodium: 143 mmol/L (ref 134–144)
eGFR: 75 mL/min/{1.73_m2} (ref >59)

## 2023-05-16 LAB — CBC
Hematocrit: 43 % (ref 34.0–46.6)
Hemoglobin: 14 g/dL (ref 11.1–15.9)
MCH: 29.2 pg (ref 26.6–33.0)
MCHC: 32.6 g/dL (ref 31.5–35.7)
MCV: 90 fL (ref 79–97)
Platelets: 177 10*3/uL (ref 150–450)
RBC: 4.8 x10E6/uL (ref 3.77–5.28)
RDW: 13.5 % (ref 11.7–15.4)
WBC: 6.6 10*3/uL (ref 3.4–10.8)

## 2023-05-16 LAB — LIPID PANEL
Chol/HDL Ratio: 3.4 ratio (ref 0.0–4.4)
Cholesterol, Total: 246 mg/dL — ABNORMAL HIGH (ref 100–199)
HDL: 73 mg/dL (ref >39)
LDL Chol Calc (NIH): 163 mg/dL — ABNORMAL HIGH (ref 0–99)
Triglycerides: 62 mg/dL (ref 0–149)
VLDL Cholesterol Cal: 10 mg/dL (ref 5–40)

## 2023-05-20 ENCOUNTER — Other Ambulatory Visit: Payer: Self-pay

## 2023-05-20 MED ORDER — DILTIAZEM HCL 30 MG PO TABS: ORAL_TABLET | ORAL | 3 refills | Status: AC

## 2023-05-29 ENCOUNTER — Other Ambulatory Visit: Payer: Self-pay | Admitting: Cardiology

## 2023-05-29 DIAGNOSIS — I480 Paroxysmal atrial fibrillation: Secondary | ICD-10-CM

## 2023-05-29 NOTE — Telephone Encounter (Signed)
Pt last saw Dr Jens Som 05/15/23, last labs 05/15/23 Creat 0.79, age 82, weight 97.6kg, CrCl 86.05, based on CrCl pt is on appropriate dosage of Xarelto 20mg  every day for afib.  Will refill rx.

## 2023-09-23 ENCOUNTER — Other Ambulatory Visit (HOSPITAL_COMMUNITY): Payer: Self-pay | Admitting: Neurological Surgery

## 2023-09-23 DIAGNOSIS — D329 Benign neoplasm of meninges, unspecified: Secondary | ICD-10-CM

## 2023-10-21 ENCOUNTER — Ambulatory Visit (HOSPITAL_COMMUNITY)
Admission: RE | Admit: 2023-10-21 | Discharge: 2023-10-21 | Disposition: A | Discharge status: Home / Self Care | Payer: PPO | Source: Ambulatory Visit | Attending: Neurological Surgery | Admitting: Neurological Surgery

## 2023-10-21 DIAGNOSIS — R220 Localized swelling, mass and lump, head: Secondary | ICD-10-CM | POA: Diagnosis not present

## 2023-10-21 DIAGNOSIS — D329 Benign neoplasm of meninges, unspecified: Secondary | ICD-10-CM | POA: Diagnosis present

## 2023-10-21 DIAGNOSIS — D320 Benign neoplasm of cerebral meninges: Secondary | ICD-10-CM | POA: Diagnosis not present

## 2023-10-21 MED ORDER — GADOBUTROL 1 MMOL/ML IV SOLN
10.0000 mL | Freq: Once | INTRAVENOUS | Status: AC | PRN
  Administered 2023-10-21: 10 mL via INTRAVENOUS

## 2023-10-31 ENCOUNTER — Other Ambulatory Visit: Payer: Self-pay | Admitting: Family Medicine

## 2023-10-31 ENCOUNTER — Ambulatory Visit
Admission: RE | Admit: 2023-10-31 | Discharge: 2023-10-31 | Disposition: A | Discharge status: Home / Self Care | Payer: PPO | Source: Ambulatory Visit | Attending: Family Medicine | Admitting: Family Medicine

## 2023-10-31 DIAGNOSIS — E669 Obesity, unspecified: Secondary | ICD-10-CM | POA: Diagnosis not present

## 2023-10-31 DIAGNOSIS — R1030 Lower abdominal pain, unspecified: Secondary | ICD-10-CM

## 2023-10-31 DIAGNOSIS — Z6837 Body mass index (BMI) 37.0-37.9, adult: Secondary | ICD-10-CM | POA: Diagnosis not present

## 2023-10-31 MED ORDER — IOPAMIDOL (ISOVUE-370) INJECTION 76%
500.0000 mL | Freq: Once | INTRAVENOUS | Status: AC | PRN
  Administered 2023-10-31: 80 mL via INTRAVENOUS

## 2023-11-11 DIAGNOSIS — D329 Benign neoplasm of meninges, unspecified: Secondary | ICD-10-CM | POA: Diagnosis not present

## 2023-11-25 ENCOUNTER — Other Ambulatory Visit: Payer: Self-pay | Admitting: Cardiology

## 2023-11-25 DIAGNOSIS — I480 Paroxysmal atrial fibrillation: Secondary | ICD-10-CM

## 2023-11-25 NOTE — Telephone Encounter (Signed)
 Prescription refill request for Xarelto received.  Indication: PAF Last office visit: 05/15/23  Velta Addison MD Weight: 97.6kg Age: 83 Scr: 0.79 on 05/15/23  Epic CrCl: 86.05  Based on above findings Xarelto 20mg  daily is the appropriate dose.  Refill approved.

## 2023-12-01 DIAGNOSIS — Z6837 Body mass index (BMI) 37.0-37.9, adult: Secondary | ICD-10-CM | POA: Diagnosis not present

## 2023-12-01 DIAGNOSIS — E669 Obesity, unspecified: Secondary | ICD-10-CM | POA: Diagnosis not present

## 2023-12-16 ENCOUNTER — Encounter: Payer: Self-pay | Admitting: Internal Medicine

## 2023-12-16 ENCOUNTER — Ambulatory Visit: Payer: PPO | Admitting: Internal Medicine

## 2023-12-16 ENCOUNTER — Other Ambulatory Visit (INDEPENDENT_AMBULATORY_CARE_PROVIDER_SITE_OTHER)

## 2023-12-16 VITALS — BP 130/70 | HR 67 | Ht 62.5 in | Wt 219.0 lb

## 2023-12-16 DIAGNOSIS — K625 Hemorrhage of anus and rectum: Secondary | ICD-10-CM

## 2023-12-16 DIAGNOSIS — K648 Other hemorrhoids: Secondary | ICD-10-CM

## 2023-12-16 DIAGNOSIS — Z860101 Personal history of adenomatous and serrated colon polyps: Secondary | ICD-10-CM

## 2023-12-16 DIAGNOSIS — Z860100 Personal history of colon polyps, unspecified: Secondary | ICD-10-CM

## 2023-12-16 DIAGNOSIS — K5901 Slow transit constipation: Secondary | ICD-10-CM

## 2023-12-16 DIAGNOSIS — K5909 Other constipation: Secondary | ICD-10-CM

## 2023-12-16 LAB — CBC WITH DIFFERENTIAL/PLATELET
Basophils Absolute: 0 10*3/uL (ref 0.0–0.1)
Basophils Relative: 0.7 % (ref 0.0–3.0)
Eosinophils Absolute: 0 10*3/uL (ref 0.0–0.7)
Eosinophils Relative: 0.6 % (ref 0.0–5.0)
HCT: 41.3 % (ref 36.0–46.0)
Hemoglobin: 13.9 g/dL (ref 12.0–15.0)
Lymphocytes Relative: 22.5 % (ref 12.0–46.0)
Lymphs Abs: 1.2 10*3/uL (ref 0.7–4.0)
MCHC: 33.6 g/dL (ref 30.0–36.0)
MCV: 87.8 fl (ref 78.0–100.0)
Monocytes Absolute: 0.5 10*3/uL (ref 0.1–1.0)
Monocytes Relative: 8.3 % (ref 3.0–12.0)
Neutro Abs: 3.8 10*3/uL (ref 1.4–7.7)
Neutrophils Relative %: 67.9 % (ref 43.0–77.0)
Platelets: 158 10*3/uL (ref 150.0–400.0)
RBC: 4.7 Mil/uL (ref 3.87–5.11)
RDW: 14.4 % (ref 11.5–15.5)
WBC: 5.6 10*3/uL (ref 4.0–10.5)

## 2023-12-16 MED ORDER — HYDROCORTISONE (PERIANAL) 2.5 % EX CREA: 1.0000 | TOPICAL_CREAM | Freq: Every day | CUTANEOUS | 1 refills | Status: AC

## 2023-12-16 NOTE — Patient Instructions (Addendum)
  Your provider has requested that you go to the basement level for lab work before leaving today. Press "B" on the elevator. The lab is located at the first door on the left as you exit the elevator.  We have sent the following medications to your pharmacy for you to pick up at your convenience:  Anusol HC Cream - use at bedtime for 2 weeks and then as needed.  Purchase Preparation H suppositories over the counter.  Apply a pea sized amount of the Anusol cream to a suppository and insert rectally.  Take 1 tablespoon of Metamucil daily.  Use Miralax as needed  _______________________________________________________  If your blood pressure at your visit was 140/90 or greater, please contact your primary care physician to follow up on this.  _______________________________________________________  If you are age 82 or older, your body mass index should be between 23-30. Your Body mass index is 39.42 kg/m. If this is out of the aforementioned range listed, please consider follow up with your Primary Care Provider.  If you are age 31 or younger, your body mass index should be between 19-25. Your Body mass index is 39.42 kg/m. If this is out of the aformentioned range listed, please consider follow up with your Primary Care Provider.   ________________________________________________________  The [facility] providers would like to encourage you to use MYCHART to communicate with providers for non-urgent requests or questions.  Due to long hold times on the telephone, sending your provider a message by [facility] may be a faster and more efficient way to get a response.  Please allow 48 business hours for a response.  Please remember that this is for non-urgent requests.  _______________________________________________________

## 2023-12-16 NOTE — Progress Notes (Signed)
 HISTORY OF PRESENT ILLNESS:  Amber Sims is a 83 y.o. female with multiple significant medical problems as listed below on chronic anticoagulation who presents today regarding constipation and possible minor rectal bleeding.  I last saw the patient in September 2017 when she underwent surveillance colonoscopy for history of adenomatous colon polyps.  Colonoscopy examinations with Dr. Grandville Lax occurred 1990, 2005, and 2010.  Her most recent examination was normal except for internal hemorrhoids.  Patient tells me that over the course of the past year she might see what possibly could be blood in the toilet water.  The bowel itself appears normal.  This often happens when she strains.  She tells me that she has always had issues with constipation.  When she becomes significantly constipated she typically takes a combination of Metamucil and MiraLAX.  She takes nothing regularly.  No abdominal pain or weight loss.  She did have complaints of low back pain into the right groin for which she underwent a CT scan of the abdomen and pelvis with contrast October 30, 2020 5.  There were no acute abnormalities.  Did have some sigmoid diverticulosis without diverticulitis and a small periumbilical hernia.  Incidental gallstones.  CBC from May 15, 2023 revealed normal hemoglobin of 14.0.  REVIEW OF SYSTEMS:  All non-GI ROS negative unless otherwise stated in the HPI except for arthritis, visual change, hearing problems, muscle cramps, lower extremity swelling, excessive urination, urinary leakage  Past Medical History:  Diagnosis Date   Allergy    occasionally    Anxiety    closterphobia   Arthritis    knees and thumbs    Cataract    bilaterally and removed   Colon polyps    Eye pressure    elevated eye pressure because of thickened cornea but monitoring for glaucona   GERD (gastroesophageal reflux disease)    Hyperlipidemia    Hypertension    IBS (irritable bowel syndrome)    Osteopenia     Other fracture of right great toe, subsequent encounter for fracture with routine healing    Sciatica     Past Surgical History:  Procedure Laterality Date   APPENDECTOMY     COLONOSCOPY     DILATION AND CURETTAGE OF UTERUS     x 6    ENDOMETRIAL ABLATION     EXPLORATORY LAPAROTOMY     POLYPECTOMY     TONSILLECTOMY      Social History Issabella Rix Sims  reports that she has never smoked. She has never used smokeless tobacco. She reports that she does not drink alcohol and does not use drugs.  family history includes Cancer - Lung in her brother; Colon polyps in her brother; Dementia in her brother and mother.  Allergies  Allergen Reactions   Caltrate [Calcium Carbonate]     constipation   Fosamax [Alendronate Sodium] Other (See Comments)    indegestion    Other Other (See Comments)   Statins    Timolol    Welchol [Colesevelam Hcl]    Zetia [Ezetimibe]        PHYSICAL EXAMINATION: Vital signs: BP 130/70   Pulse 67   Ht 5' 2.5" (1.588 m)   Wt 219 lb (99.3 kg)   BMI 39.42 kg/m   Constitutional: Elderly female, overweight, otherwise generally well-appearing, no acute distress Psychiatric: alert and oriented x3, cooperative Eyes: extraocular movements intact, anicteric, conjunctiva pink Mouth: oral pharynx moist, no lesions Neck: supple no lymphadenopathy Cardiovascular: heart regular rate and rhythm, no murmur  Lungs: clear to auscultation bilaterally Abdomen: soft, nontender, nondistended, small umbilical hernia, no obvious ascites, no peritoneal signs, normal bowel sounds, no organomegaly Rectal: Deferred per patient request Extremities: no clubbing or cyanosis.  Trace lower extremity edema bilaterally Skin: no lesions on visible extremities Neuro: No focal deficits.  Cranial nerves intact  ASSESSMENT:  1.  Possibly minor rectal bleeding.  Likely due to internal hemorrhoids 2.  Chronic constipation 3.  History of adenomatous colon polyps.  Last  colonoscopy 2017 was negative for neoplasia.  Internal hemorrhoids noted 4.  Multiple medical problems   PLAN:  1.  Prescribe Anusol HC suppositories.  Take 1 at night for 2 weeks then use on demand 2.  Metamucil 1 tablespoon daily 3.  MiraLAX as needed 4.  CBC today.  Compare to September 5.  Routine office visit 2 months.  If having any issues, I recommended anoscopy. A total time of 45 minutes was spent preparing see the patient, reviewing a myriad of data, obtaining comprehensive history, performing medically appropriate physical exam, counseling and educating the patient regarding the above listed issues, ordering medication, ordering blood work, arranging follow-up, and documenting clinical information in the health record

## 2023-12-22 DIAGNOSIS — L718 Other rosacea: Secondary | ICD-10-CM | POA: Diagnosis not present

## 2023-12-22 DIAGNOSIS — Z85828 Personal history of other malignant neoplasm of skin: Secondary | ICD-10-CM | POA: Diagnosis not present

## 2023-12-22 DIAGNOSIS — D1801 Hemangioma of skin and subcutaneous tissue: Secondary | ICD-10-CM | POA: Diagnosis not present

## 2023-12-22 DIAGNOSIS — L821 Other seborrheic keratosis: Secondary | ICD-10-CM | POA: Diagnosis not present

## 2023-12-22 DIAGNOSIS — D225 Melanocytic nevi of trunk: Secondary | ICD-10-CM | POA: Diagnosis not present

## 2023-12-22 DIAGNOSIS — D2272 Melanocytic nevi of left lower limb, including hip: Secondary | ICD-10-CM | POA: Diagnosis not present

## 2023-12-22 DIAGNOSIS — D2271 Melanocytic nevi of right lower limb, including hip: Secondary | ICD-10-CM | POA: Diagnosis not present

## 2023-12-22 DIAGNOSIS — D2239 Melanocytic nevi of other parts of face: Secondary | ICD-10-CM | POA: Diagnosis not present

## 2023-12-22 DIAGNOSIS — L814 Other melanin hyperpigmentation: Secondary | ICD-10-CM | POA: Diagnosis not present

## 2023-12-31 DIAGNOSIS — Z6837 Body mass index (BMI) 37.0-37.9, adult: Secondary | ICD-10-CM | POA: Diagnosis not present

## 2023-12-31 DIAGNOSIS — E669 Obesity, unspecified: Secondary | ICD-10-CM | POA: Diagnosis not present

## 2024-01-15 DIAGNOSIS — Z6841 Body Mass Index (BMI) 40.0 and over, adult: Secondary | ICD-10-CM | POA: Diagnosis not present

## 2024-01-15 DIAGNOSIS — N3946 Mixed incontinence: Secondary | ICD-10-CM | POA: Diagnosis not present

## 2024-01-15 DIAGNOSIS — K5909 Other constipation: Secondary | ICD-10-CM | POA: Diagnosis not present

## 2024-01-15 DIAGNOSIS — N3010 Interstitial cystitis (chronic) without hematuria: Secondary | ICD-10-CM | POA: Diagnosis not present

## 2024-01-31 DIAGNOSIS — E669 Obesity, unspecified: Secondary | ICD-10-CM | POA: Diagnosis not present

## 2024-01-31 DIAGNOSIS — Z6837 Body mass index (BMI) 37.0-37.9, adult: Secondary | ICD-10-CM | POA: Diagnosis not present

## 2024-02-12 ENCOUNTER — Telehealth: Payer: Self-pay | Admitting: Cardiology

## 2024-02-12 DIAGNOSIS — H34232 Retinal artery branch occlusion, left eye: Secondary | ICD-10-CM | POA: Diagnosis not present

## 2024-02-12 NOTE — Telephone Encounter (Signed)
 Attempted to contact MD's office for diagnosis/reason for appt and carotid duplex. Office closed. Will try again tomorrow.

## 2024-02-12 NOTE — Telephone Encounter (Signed)
 Per doctor pt needs appt and carotid duplex asap

## 2024-02-13 ENCOUNTER — Other Ambulatory Visit: Payer: Self-pay | Admitting: Family Medicine

## 2024-02-13 ENCOUNTER — Ambulatory Visit: Payer: Self-pay | Admitting: Cardiology

## 2024-02-13 ENCOUNTER — Ambulatory Visit (HOSPITAL_COMMUNITY)
Admission: RE | Admit: 2024-02-13 | Discharge: 2024-02-13 | Disposition: A | Discharge status: Home / Self Care | Source: Ambulatory Visit | Attending: Cardiology | Admitting: Cardiology

## 2024-02-13 DIAGNOSIS — H34232 Retinal artery branch occlusion, left eye: Secondary | ICD-10-CM

## 2024-02-13 DIAGNOSIS — H349 Unspecified retinal vascular occlusion: Secondary | ICD-10-CM

## 2024-02-13 NOTE — Telephone Encounter (Signed)
 Spoke with Dr Mccuen, she reports the patient has a branch retinal artery occlusion of the left eye and wants her to get carotid dopplers asap. Order placed for stat carotid dopplers.

## 2024-02-13 NOTE — Telephone Encounter (Signed)
 Patient will have dopplers done today.

## 2024-02-13 NOTE — Telephone Encounter (Signed)
 Left message for dr mccuen's office to call.

## 2024-02-14 DIAGNOSIS — I340 Nonrheumatic mitral (valve) insufficiency: Secondary | ICD-10-CM | POA: Diagnosis not present

## 2024-02-14 DIAGNOSIS — G9389 Other specified disorders of brain: Secondary | ICD-10-CM | POA: Diagnosis not present

## 2024-02-14 DIAGNOSIS — Z7901 Long term (current) use of anticoagulants: Secondary | ICD-10-CM | POA: Diagnosis not present

## 2024-02-14 DIAGNOSIS — H34232 Retinal artery branch occlusion, left eye: Secondary | ICD-10-CM | POA: Diagnosis not present

## 2024-02-14 DIAGNOSIS — R252 Cramp and spasm: Secondary | ICD-10-CM | POA: Diagnosis not present

## 2024-02-14 DIAGNOSIS — H539 Unspecified visual disturbance: Secondary | ICD-10-CM | POA: Diagnosis not present

## 2024-02-14 DIAGNOSIS — I3481 Nonrheumatic mitral (valve) annulus calcification: Secondary | ICD-10-CM | POA: Diagnosis not present

## 2024-02-14 DIAGNOSIS — I4891 Unspecified atrial fibrillation: Secondary | ICD-10-CM | POA: Diagnosis not present

## 2024-02-14 DIAGNOSIS — H34231 Retinal artery branch occlusion, right eye: Secondary | ICD-10-CM | POA: Diagnosis not present

## 2024-02-14 DIAGNOSIS — D320 Benign neoplasm of cerebral meninges: Secondary | ICD-10-CM | POA: Diagnosis not present

## 2024-02-15 DIAGNOSIS — H539 Unspecified visual disturbance: Secondary | ICD-10-CM | POA: Diagnosis not present

## 2024-02-15 DIAGNOSIS — I340 Nonrheumatic mitral (valve) insufficiency: Secondary | ICD-10-CM | POA: Diagnosis not present

## 2024-02-15 DIAGNOSIS — I3481 Nonrheumatic mitral (valve) annulus calcification: Secondary | ICD-10-CM | POA: Diagnosis not present

## 2024-02-16 ENCOUNTER — Telehealth: Payer: Self-pay | Admitting: *Deleted

## 2024-02-16 MED ORDER — APIXABAN 5 MG PO TABS: 5.0000 mg | ORAL_TABLET | Freq: Two times a day (BID) | ORAL | 6 refills | Status: DC

## 2024-02-16 NOTE — Telephone Encounter (Signed)
 Patient went to [facility] due to retinal occlusion per dr Rhae Cease. Her carotid dopplers were normal. She was discharged home and was to check with us  about changing xarelto  to eliquis. She would be okay with that change if needed. She has a follow up with the APP 6/24 and wonders if she needs that appointment. They also wanted her to talk to us  about repatha. She is not sure she wants to do injections. Aware will forward information to dr Audery Blazing.

## 2024-02-16 NOTE — Telephone Encounter (Signed)
Spoke with pt, Aware of dr crenshaw's recommendations. New script sent to the pharmacy  

## 2024-02-16 NOTE — Telephone Encounter (Signed)
 Pt was in [facility] ER this weekend and wants to discuss this with nurse

## 2024-02-18 DIAGNOSIS — Z9189 Other specified personal risk factors, not elsewhere classified: Secondary | ICD-10-CM | POA: Diagnosis not present

## 2024-02-18 DIAGNOSIS — H349 Unspecified retinal vascular occlusion: Secondary | ICD-10-CM | POA: Diagnosis not present

## 2024-02-19 ENCOUNTER — Encounter: Payer: Self-pay | Admitting: Internal Medicine

## 2024-02-19 ENCOUNTER — Telehealth: Payer: Self-pay

## 2024-02-19 ENCOUNTER — Ambulatory Visit: Admitting: Internal Medicine

## 2024-02-19 VITALS — BP 146/82 | HR 61 | Ht 62.5 in | Wt 213.2 lb

## 2024-02-19 DIAGNOSIS — K5909 Other constipation: Secondary | ICD-10-CM | POA: Diagnosis not present

## 2024-02-19 DIAGNOSIS — K648 Other hemorrhoids: Secondary | ICD-10-CM

## 2024-02-19 DIAGNOSIS — K625 Hemorrhage of anus and rectum: Secondary | ICD-10-CM

## 2024-02-19 DIAGNOSIS — Z862 Personal history of diseases of the blood and blood-forming organs and certain disorders involving the immune mechanism: Secondary | ICD-10-CM

## 2024-02-19 DIAGNOSIS — K5901 Slow transit constipation: Secondary | ICD-10-CM

## 2024-02-19 NOTE — Patient Instructions (Signed)
 Continue using Hydrocortisone  cream/suppositories.  Please follow up as needed.  _______________________________________________________  If your blood pressure at your visit was 140/90 or greater, please contact your primary care physician to follow up on this.  _______________________________________________________  If you are age 82 or older, your body mass index should be between 23-30. Your Body mass index is 38.38 kg/m. If this is out of the aforementioned range listed, please consider follow up with your Primary Care Provider.  If you are age 20 or younger, your body mass index should be between 19-25. Your Body mass index is 38.38 kg/m. If this is out of the aformentioned range listed, please consider follow up with your Primary Care Provider.   ________________________________________________________  The [facility] providers would like to encourage you to use MYCHART to communicate with providers for non-urgent requests or questions.  Due to long hold times on the telephone, sending your provider a message by [facility] may be a faster and more efficient way to get a response.  Please allow 48 business hours for a response.  Please remember that this is for non-urgent requests.  _______________________________________________________

## 2024-02-20 ENCOUNTER — Telehealth: Payer: Self-pay

## 2024-02-20 NOTE — Progress Notes (Signed)
 Complex Care Management Note Care Guide Note  02/20/2024 Name: Amber Sims: [id number] DOB: [birth year]  Amber Sims is a 83 y.o. year old female who is a primary care patient of Hagler, Kayleen Party, MD . The community resource team was consulted for assistance with Transportation Needs   SDOH screenings and interventions completed:  Yes  Social Drivers of Health From This Encounter   Food Insecurity: No Food Insecurity (02/20/2024)   Hunger Vital Sign    Worried About Running Out of Food in the Last Year: Never true    Ran Out of Food in the Last Year: Never true  Housing: Low Risk  (02/20/2024)   Housing Stability Vital Sign    Unable to Pay for Housing in the Last Year: No    Number of Times Moved in the Last Year: 0    Homeless in the Last Year: No  Financial Resource Strain: Low Risk  (02/20/2024)   Overall Financial Resource Strain (CARDIA)    Difficulty of Paying Living Expenses: Not hard at all  Transportation Needs: No Transportation Needs (02/20/2024)   PRAPARE - Administrator, Civil Service (Medical): No    Lack of Transportation (Non-Medical): No  Utilities: Not At Risk (02/20/2024)   Utilities    Threatened with loss of utilities: No    SDOH Interventions Today    Flowsheet Row Most Recent Value  SDOH Interventions   Transportation Interventions Other (Comment)  [Spoke with patient about [employer] transportation. Patient felt that she may be driving again by the time her application is processed and approved. She requested I mail the application to her home address.]     Care guide performed the following interventions: Patient provided with information about care guide support team and interviewed to confirm resource needs.  Follow Up Plan:  No further follow up planned at this time. The patient has been provided with needed resources.  Encounter Outcome:  Patient Visit Completed  Ashby Moskal [facility]  [facility] Guide Direct Dial: [phone]  Fax: [phone] Website: [email or web address]

## 2024-02-24 ENCOUNTER — Encounter: Payer: Self-pay | Admitting: Physician Assistant

## 2024-02-24 ENCOUNTER — Encounter: Payer: Self-pay | Admitting: Internal Medicine

## 2024-02-24 ENCOUNTER — Ambulatory Visit: Attending: Physician Assistant | Admitting: Physician Assistant

## 2024-02-24 VITALS — BP 130/72 | HR 64 | Ht 62.5 in | Wt 213.0 lb

## 2024-02-24 DIAGNOSIS — I10 Essential (primary) hypertension: Secondary | ICD-10-CM

## 2024-02-24 DIAGNOSIS — H34232 Retinal artery branch occlusion, left eye: Secondary | ICD-10-CM

## 2024-02-24 DIAGNOSIS — I480 Paroxysmal atrial fibrillation: Secondary | ICD-10-CM | POA: Diagnosis not present

## 2024-02-24 DIAGNOSIS — E785 Hyperlipidemia, unspecified: Secondary | ICD-10-CM

## 2024-02-24 NOTE — Patient Instructions (Signed)
 Medication Instructions:  NO CHANGES *If you need a refill on your cardiac medications before your next appointment, please call your pharmacy*  Lab Work: NO LABS If you have labs (blood work) drawn today and your tests are completely normal, you will receive your results only by: MyChart Message (if you have MyChart) OR A paper copy in the mail If you have any lab test that is abnormal or we need to change your treatment, we will call you to review the results.  Testing/Procedures: NO TESTING  Follow-Up: At [facility], you and your health needs are our priority.  As part of our continuing mission to provide you with exceptional heart care, our providers are all part of one team.  This team includes your primary Cardiologist (physician) and Advanced Practice Providers or APPs (Physician Assistants and Nurse Practitioners) who all work together to provide you with the care you need, when you need it.  Your next appointment:   4-6 month(s)  Provider:   Redell Shallow, MD

## 2024-02-24 NOTE — Progress Notes (Signed)
 HISTORY OF PRESENT ILLNESS:  Amber Sims is a 83 y.o. female who was seen in the office December 16, 2023 regarding minor rectal bleeding presumed secondary to hemorrhoids, and chronic constipation.  See that dictation.  She declined rectal evaluation at that time.  She was prescribed Anusol  suppositories, Metamucil, and MiraLAX.  Normal hemoglobin.  Presents today for follow-up.  She is pleased to tell me that her issues with bleeding have resolved.  Her bowel habits have improved on Metamucil and MiraLAX.  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  Otherwise, from a GI perspective, doing well.  REVIEW OF SYSTEMS:  All non-GI ROS negative. 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 She has never used smokeless tobacco. She reports that she does not drink alcohol and does not use drugs.  family history includes Cancer - Lung in her brother; Colon polyps in her brother; Dementia in her brother and mother.  Allergies  Allergen Reactions   Caltrate [Calcium Carbonate]     constipation   Fosamax [Alendronate Sodium] Other (See Comments)    indegestion    Other Other (See  Comments)   Statins    Timolol    Welchol [Colesevelam Hcl]    Zetia [Ezetimibe]        PHYSICAL EXAMINATION: Vital signs: BP (!) 146/82   Pulse 61   Ht 5' 2.5 (1.588 m)   Wt 213 lb 4 oz (96.7 kg)   BMI 38.38 kg/m  General: Well-developed, well-nourished, no acute distress HEENT: Sclerae are anicteric, conjunctiva pink. Oral mucosa intact Lungs: Clear Heart: Regular Abdomen: soft, nontender, nondistended, no obvious ascites, no peritoneal signs, normal bowel sounds. No organomegaly. Extremities: No edema Psychiatric: alert and oriented x3. Cooperative     ASSESSMENT:  1.  Hemorrhoidal bleeding.  Treated with suppositories.  Resolved 2.  Chronic constipation.  Treated with fiber and MiraLAX   PLAN:  1.  Continue medicated suppositories on demand 2.  Continue agents for chronic constipation 3.  Follow-up as needed

## 2024-02-24 NOTE — Progress Notes (Signed)
 Cardiology Office Note   Date:  02/24/2024  ID:  Kemba Hoppes, DOB May 05, 1941, MRN [id number] PCP: Rolinda Millman, MD  [facility] Providers Cardiologist:  Redell Shallow, MD     History of Present Illness Amber Sims is a 83 y.o. female with past medical history of PAF, hypertension and hyperlipidemia.  She was seen in the ED in December 2018 with A-fib and converted to sinus rhythm spontaneously.  She has been treated with as needed dose of Cardizem  and also scheduled dose of Xarelto .  TSH was normal.  Myoview  in January 2019 showed no ischemia.  Echocardiogram at the time showed normal EF, mild MR.  Patient was last seen by Dr. Shallow in September 2024 at which time she was doing well.  She has been intolerant to statins and Zetia.  Her lipid panel obtained at the time showed LDL 163, total cholesterol 246.  Given lack of prior diagnosis of vascular disease, diet and exercise was recommended.  Patient was recently diagnosed with retinal artery occlusion, carotid ultrasound obtained on 02/13/2024 showed 1 to 39% bilateral ICA disease, no significant hemodynamically significant lesion.  Normal subclavian artery flow and vertebral artery flow.  Patient was subsequently admitted to [facility] on 02/14/2024 for left eye visual disturbance consistent with branch retinal artery occlusion.  MRI of the brain was negative for stroke, it does show a known right paraclinoid meningioma.  CTA of the neck was deferred given recent carotid ultrasound.  Lower extremity venous Doppler was negative for DVT.  TTE obtained on 02/15/2024 showed EF 65 to 70%, moderate mitral annular calcification, mild MR, injection of agitated saline showed no right-to-left shunt.  Neurology service recommended a trial of Eliquis over Xarelto  and consideration of PCSK9 inhibitor due to new diagnosed vascular disease.  Hemoglobin A1c during the hospitalization was 5.7.  LDL 145.  Since discharge, Dr. Shallow has  switched her Xarelto  to Eliquis 5 mg twice a day.  She presents today for cardiology follow-up.  She is doing well, she feels slight improvement in her left eye's vision.  She has occasional neck soreness but no chest pain or shortness of breath.  She has no lower extremity edema on exam.  Her lung is clear.  We discussed the recent hospitalization and the need to control her cholesterol better.  Her current LDL was 145 at [facility], however her LDL goal with recent event should be less than 70.  Therefore, I recommend referral to lipid clinic to consider either PCSK9 inhibitors (Praluent or Repatha) versus inclisiran.  We discussed that PCSK9 inhibitors is once every 2-week injection whereas inclisiran is once every 37-month injection (although first and second injection is 3 months apart).  I recommend referral to lipid clinic so our clinical pharmacist can review her case and figure out which medication is covered better by her insurance company.  Otherwise, she can follow-up with Dr. Shallow in 4 to 6 months.  ROS:   She denies chest pain, palpitations, dyspnea, pnd, orthopnea, n, v, dizziness, syncope, edema, weight gain, or early satiety. All other systems reviewed and are otherwise negative except as noted above.   Continue to have left eye vision problem, however improved compared to prior to going to the hospital.  Studies Reviewed EKG Interpretation Date/Time:  Tuesday February 24 2024 09:16:19 EDT Ventricular Rate:  59 PR Interval:  150 QRS Duration:  76 QT Interval:  418 QTC Calculation: 413 R Axis:   75  Text Interpretation: Sinus bradycardia When compared  with ECG of 15-May-2023 09:17, No significant change was found Confirmed by Janene Boer [id number]) on 02/24/2024 10:12:22 AM    Cardiac Studies & Procedures   ______________________________________________________________________________________________   STRESS TESTS  MYOCARDIAL PERFUSION IMAGING 09/29/2017             ______________________________________________________________________________________________      Risk Assessment/Calculations  CHA2DS2-VASc Score = 5   This indicates a 7.2% annual risk of stroke. The patient's score is based upon: CHF History: 0 HTN History: 1 Diabetes History: 0 Stroke History: 0 Vascular Disease History: 1 Age Score: 2 Gender Score: 1            Physical Exam VS:  BP 130/72   Pulse 64   Ht 5' 2.5 (1.588 m)   Wt 213 lb (96.6 kg)   SpO2 96%   BMI 38.34 kg/m        Wt Readings from Last 3 Encounters:  02/24/24 213 lb (96.6 kg)  02/19/24 213 lb 4 oz (96.7 kg)  12/16/23 219 lb (99.3 kg)    GEN: Well nourished, well developed in no acute distress NECK: No JVD; No carotid bruits CARDIAC: RRR, no murmurs, rubs, gallops RESPIRATORY:  Clear to auscultation without rales, wheezing or rhonchi  ABDOMEN: Soft, non-tender, non-distended EXTREMITIES:  No edema; No deformity   ASSESSMENT AND PLAN  Branch left retinal artery occlusion: Diagnosed recently and was admitted to [facility] hospital.  Carotid ultrasound prior to hospitalization showed 1 to 39% bilateral internal carotid artery disease.  Echocardiogram during the hospitalization showed normal EF, no significant valvular disease, no shunting.  Patient was seen by neurology service who recommended switching Xarelto  to Eliquis and control cholesterol better.  Since discharge, her Xarelto  was discontinued and switched to Eliquis 5 mg twice a day by Dr. Pietro.  PAF: Diagnosed in December 2018, maintaining sinus rhythm.  Due to recent branch left retinal artery occlusion, Xarelto  was switched to Eliquis 5 mg twice a day.  Will continue on the current therapy.  Patient is holding sinus rhythm based on today's EKG  Hypertension: Blood pressure well-controlled  Hyperlipidemia: Previously intolerant of statins and Zetia, however given the recently diagnosed branched left retinal artery occlusion, she  will need to control cholesterol better.  LDL goal is less than 70 given the recent finding.  Will refer the patient to lipid clinic to consider either PCSK9 inhibitors versus inclisiran.       Dispo: Follow-up with Dr. Pietro in 4 to 6 months.  Signed, Boer Janene, PA

## 2024-02-27 DIAGNOSIS — I491 Atrial premature depolarization: Secondary | ICD-10-CM | POA: Diagnosis not present

## 2024-02-27 DIAGNOSIS — G459 Transient cerebral ischemic attack, unspecified: Secondary | ICD-10-CM | POA: Diagnosis not present

## 2024-03-01 DIAGNOSIS — Z6837 Body mass index (BMI) 37.0-37.9, adult: Secondary | ICD-10-CM | POA: Diagnosis not present

## 2024-03-01 DIAGNOSIS — H34232 Retinal artery branch occlusion, left eye: Secondary | ICD-10-CM | POA: Diagnosis not present

## 2024-03-01 DIAGNOSIS — E669 Obesity, unspecified: Secondary | ICD-10-CM | POA: Diagnosis not present

## 2024-03-01 DIAGNOSIS — H5340 Unspecified visual field defects: Secondary | ICD-10-CM | POA: Diagnosis not present

## 2024-04-01 DIAGNOSIS — E669 Obesity, unspecified: Secondary | ICD-10-CM | POA: Diagnosis not present

## 2024-04-01 DIAGNOSIS — Z6837 Body mass index (BMI) 37.0-37.9, adult: Secondary | ICD-10-CM | POA: Diagnosis not present

## 2024-04-09 DIAGNOSIS — I10 Essential (primary) hypertension: Secondary | ICD-10-CM | POA: Diagnosis not present

## 2024-04-09 DIAGNOSIS — F419 Anxiety disorder, unspecified: Secondary | ICD-10-CM | POA: Diagnosis not present

## 2024-04-09 DIAGNOSIS — I480 Paroxysmal atrial fibrillation: Secondary | ICD-10-CM | POA: Diagnosis not present

## 2024-04-09 DIAGNOSIS — E7800 Pure hypercholesterolemia, unspecified: Secondary | ICD-10-CM | POA: Diagnosis not present

## 2024-04-09 DIAGNOSIS — H34232 Retinal artery branch occlusion, left eye: Secondary | ICD-10-CM | POA: Diagnosis not present

## 2024-04-15 ENCOUNTER — Telehealth: Payer: Self-pay | Admitting: Pharmacist

## 2024-04-15 ENCOUNTER — Encounter: Payer: Self-pay | Admitting: Pharmacist

## 2024-04-15 ENCOUNTER — Ambulatory Visit: Attending: Internal Medicine | Admitting: Pharmacist

## 2024-04-15 ENCOUNTER — Telehealth: Payer: Self-pay | Admitting: Pharmacy Technician

## 2024-04-15 ENCOUNTER — Other Ambulatory Visit (HOSPITAL_COMMUNITY): Payer: Self-pay

## 2024-04-15 DIAGNOSIS — E785 Hyperlipidemia, unspecified: Secondary | ICD-10-CM

## 2024-04-15 DIAGNOSIS — E7800 Pure hypercholesterolemia, unspecified: Secondary | ICD-10-CM

## 2024-04-15 MED ORDER — EZETIMIBE 10 MG PO TABS
10.0000 mg | ORAL_TABLET | Freq: Every day | ORAL | 3 refills | Status: AC
  Filled 2024-04-15: qty 90, 90d supply, fill #0

## 2024-04-15 MED ORDER — REPATHA SURECLICK 140 MG/ML ~~LOC~~ SOAJ: 140.0000 mg | SUBCUTANEOUS | 3 refills | Status: DC

## 2024-04-15 NOTE — Assessment & Plan Note (Signed)
 Assessment:  LDL goal: < 70 mg/dl last LDLc  854  mg/dl (93/85/7974) Intolerance to statins ( Lipitor and Crestor) -severe myalgia and indigestion from Zetia    Discussed next potential options (PCSK-9 inhibitors, bempedoic acid and inclisiran); cost, dosing efficacy, side effects  Follows heart healthy diet and exercise regularly   Plan: Continue taking current medications (Crestor 5 mg 3 times per week) Start taking Zetia  10 mg daily with food from Aug 25,2025  Will apply for PA for PCSK9i; will inform patient upon approval  Lipid lab due in 2-3 months of therapy modification

## 2024-04-15 NOTE — Telephone Encounter (Signed)
 From pt calls   Pharmacy Patient Advocate Encounter   Received notification from Pt Calls Messages that prior authorization for repatha  is required/requested.   Insurance verification completed.   The patient is insured through [facility] ADVANTAGE/RX ADVANCE .   Per test claim: PA required; PA submitted to above mentioned insurance via Latent Key/confirmation #/EOC [id number] Status is pending   Pharmacy Patient Advocate Encounter  Received notification from [facility] ADVANTAGE/RX ADVANCE that Prior Authorization for repatha  has been APPROVED from 04/15/24 to 10/12/24. Ran test claim, Copay is $47.00. This test claim was processed through [facility]- copay amounts may vary at other pharmacies due to pharmacy/plan contracts, or as the patient moves through the different stages of their insurance plan.   PA #/Case ID/Reference #: [id number]

## 2024-04-15 NOTE — Patient Instructions (Addendum)
 Your Results:             Your most recent labs Goal  Total Cholesterol 223 < 200  Triglycerides 73 < 150  HDL (happy/good cholesterol) 61 > 40  LDL (lousy/bad cholesterol 145 < 70   Medication changes: continue taking rosuvastatin 5 mg 3 times per week and start taking Zetia  from Aug 25.  We will start the process to get PCSK9i (Repatha  or Praluent) covered by your insurance.  Once the prior authorization is complete, we will call you to let you know and confirm pharmacy information.      Praluent is a cholesterol medication that improved your body's ability to get rid of bad cholesterol known as LDL. It can lower your LDL up to 60%. It is an injection that is given under the skin every 2 weeks. The most common side effects of Praluent include runny nose, symptoms of the common cold, rarely flu or flu-like symptoms, back/muscle pain in about 3-4% of the patients, and redness, pain, or bruising at the injection site.    Repatha  is a cholesterol medication that improved your body's ability to get rid of bad cholesterol known as LDL. It can lower your LDL up to 60%! It is an injection that is given under the skin every 2 weeks. The medication often requires a prior authorization from your insurance company. The most common side effects of Repatha  include runny nose, symptoms of the common cold, rarely flu or flu-like symptoms, back/muscle pain in about 3-4% of the patients, and redness, pain, or bruising at the injection site.   Lab orders: We want to repeat labs after 2-3 months.  We will send you a lab order to remind you once we get closer to that time.

## 2024-04-15 NOTE — Telephone Encounter (Signed)
 LVM informing Repatha  approval

## 2024-04-15 NOTE — Progress Notes (Signed)
 Patient ID: Amber Sims                 DOB: May 19, 1941                    MRN: [id number]      HPI: Amber Sims is a 83 y.o. female patient referred to lipid clinic by Janene Boer, PA. PMH is significant for PAF, hypertension and hyperlipidemia   Previously intolerant of statins and Zetia , however given the recently diagnosed branched left retinal artery occlusion, she will need to control cholesterol better. LDL goal is less than 70 given the recent finding   The patient presented today for lipid clinic follow-up. She reports prior trials of rosuvastatin and atorvastatin, both of which caused severe muscle aches and pain. She also previously tried ezetimibe  (Zetia ), which caused indigestion. Recently, she saw her primary care provider and was started on rosuvastatin 5 mg three times per week; she has taken two doses so far and is tolerating it well. We reviewed additional options for lowering LDL cholesterol, including ezetimibe , PCSK9 inhibitors, bempedoic acid, and inclisiran, discussing their mechanisms of action, dosing, side effects, and expected LDL cholesterol reductions. Cost considerations and patient assistance programs were also reviewed to support her treatment decisions.  Current Medications: Crestor 5 mg 3 times per week  Intolerances: stains - myalgia  Risk Factors: branched left retinal artery occlusion, statin intolerance  LDL goal: <70 mg/dl  Last lipid lab: TC 776, TG 73, HDL 61, LDL 145  Diet: can be improved, like meat. Mostly chicken grilled  Lost 11 lbs from diet    Exercise: trainer 2 times per week and 30 min week 3 times per week, 6-9 K steps   Family History:  Sister: stroke at age 31  Dad's sister: stroke  Social History:  Alcohol: none  Smoking : never  Labs:  Lipid Panel     Component Value Date/Time   CHOL 246 (H) 05/15/2023 0948   TRIG 62 05/15/2023 0948   HDL 73 05/15/2023 0948   CHOLHDL 3.4 05/15/2023 0948   LDLCALC 163 (H)  05/15/2023 0948   LABVLDL 10 05/15/2023 0948    Past Medical History:  Diagnosis Date   Allergy    occasionally    Anxiety    closterphobia   Arthritis    knees and thumbs    Cataract    bilaterally and removed   Colon polyps    Eye pressure    elevated eye pressure because of thickened cornea but monitoring for glaucona   GERD (gastroesophageal reflux disease)    Hyperlipidemia    Hypertension    IBS (irritable bowel syndrome)    Mini stroke    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     acetaminophen  (TYLENOL  8 HOUR ARTHRITIS PAIN) 650 MG CR tablet Take 650 mg by mouth every 8 (eight) hours as needed for pain.     apixaban  (ELIQUIS ) 5 MG TABS tablet Take 1 tablet (5 mg total) by mouth 2 (two) times daily. 60 tablet 6   Carboxymethylcellulose Sod PF (REFRESH PLUS) 0.5 % SOLN daily at 6 (six) AM.  cholecalciferol (VITAMIN D) 1000 UNITS tablet Take 1,000 Units by mouth daily.      diltiazem  (CARDIZEM ) 30 MG tablet TAKE 1 TABELT EVERY 4 HOURS AS NEEDED FOR AFIB HEART RATE OVER 100. 180 tablet 3   Famotidine  (PEPCID  PO) Take 1 tablet by mouth daily as needed.      hydrochlorothiazide (HYDRODIURIL) 12.5 MG tablet Take 12.5 mg by mouth daily.     hydrocortisone  (ANUSOL -HC) 2.5 % rectal cream Place 1 Application rectally at bedtime. (Patient not taking: Reported on 02/24/2024) 30 g 1   Lidocaine-Menthol (ICY HOT MAX LIDOCAINE) 4-1 % CREA      LORazepam (ATIVAN) 0.5 MG tablet Take by mouth as needed for anxiety.     Polyethyl Glycol-Propyl Glycol (GENTEAL TEARS SEVERE DAY/NIGHT) 0.4-0.3 % GEL ophthalmic gel      polyethylene glycol powder (GLYCOLAX/MIRALAX) 17 GM/SCOOP powder Take by mouth as  needed.     potassium chloride  (KLOR-CON ) 20 MEQ packet Take 20 mEq by mouth daily.     Psyllium (GERI-MUCIL) 68 % POWD Take by mouth daily at 6 (six) AM.     sertraline (ZOLOFT) 50 MG tablet Take 50 mg by mouth daily.     sodium chloride  (MURO 128) 5 % ophthalmic solution at bedtime.     No current facility-administered medications on file prior to visit.    Allergies  Allergen Reactions   Caltrate [Calcium Carbonate]     constipation   Fosamax [Alendronate Sodium] Other (See Comments)    indegestion    Other Other (See Comments)   Statins    Timolol    Welchol [Colesevelam Hcl]    Zetia  [Ezetimibe ]     Assessment/Plan:  1. Hyperlipidemia -  Problem  HYPERLIPIDEMIA   Qualifier: Diagnosis of  By: Nelson-Smith CMA (AAMA), Dottie    Current Medications: Crestor 5 mg 3 times per week  Intolerances: stains - myalgia  Risk Factors: branched left retinal artery occlusion, statin intolerance  LDL goal: <70 mg/dl  Last lipid lab: TC 776, TG 73, HDL 61, LDL 145 - while off of all cholesterol meds     HYPERLIPIDEMIA Assessment:  LDL goal: < 70 mg/dl last LDLc  854  mg/dl (93/85/7974) Intolerance to statins ( Lipitor and Crestor) -severe myalgia and indigestion from Zetia    Discussed next potential options (PCSK-9 inhibitors, bempedoic acid and inclisiran); cost, dosing efficacy, side effects  Follows heart healthy diet and exercise regularly   Plan: Continue taking current medications (Crestor 5 mg 3 times per week) Start taking Zetia  10 mg daily with food from Aug 25,2025  Will apply for PA for PCSK9i; will inform patient upon approval  Lipid lab due in 2-3 months of therapy modification     Thank you,  Robbi Blanch, Pharm.D [facility]. [facility] [address], [place], [place] [address] Phone: [phone]; Fax: [phone]

## 2024-05-01 DIAGNOSIS — E669 Obesity, unspecified: Secondary | ICD-10-CM | POA: Diagnosis not present

## 2024-05-01 DIAGNOSIS — Z6838 Body mass index (BMI) 38.0-38.9, adult: Secondary | ICD-10-CM | POA: Diagnosis not present

## 2024-05-20 DIAGNOSIS — E7800 Pure hypercholesterolemia, unspecified: Secondary | ICD-10-CM | POA: Diagnosis not present

## 2024-05-20 DIAGNOSIS — I10 Essential (primary) hypertension: Secondary | ICD-10-CM | POA: Diagnosis not present

## 2024-06-17 ENCOUNTER — Telehealth: Payer: Self-pay | Admitting: Pharmacist

## 2024-06-17 DIAGNOSIS — Z23 Encounter for immunization: Secondary | ICD-10-CM | POA: Diagnosis not present

## 2024-06-17 NOTE — Telephone Encounter (Signed)
 Call to remind pt about f/u lipid lab - N/A VM and MyChart msg sent

## 2024-06-18 NOTE — Addendum Note (Signed)
 Addended by: FREDIRICK BEAU B on: 06/18/2024 11:01 AM   Modules accepted: Orders

## 2024-07-02 DIAGNOSIS — H34232 Retinal artery branch occlusion, left eye: Secondary | ICD-10-CM | POA: Diagnosis not present

## 2024-07-07 DIAGNOSIS — Z1231 Encounter for screening mammogram for malignant neoplasm of breast: Secondary | ICD-10-CM | POA: Diagnosis not present

## 2024-07-09 DIAGNOSIS — E7800 Pure hypercholesterolemia, unspecified: Secondary | ICD-10-CM | POA: Diagnosis not present

## 2024-07-09 DIAGNOSIS — I10 Essential (primary) hypertension: Secondary | ICD-10-CM | POA: Diagnosis not present

## 2024-07-14 DIAGNOSIS — E7800 Pure hypercholesterolemia, unspecified: Secondary | ICD-10-CM | POA: Diagnosis not present

## 2024-07-14 DIAGNOSIS — I480 Paroxysmal atrial fibrillation: Secondary | ICD-10-CM | POA: Diagnosis not present

## 2024-07-14 DIAGNOSIS — Z8669 Personal history of other diseases of the nervous system and sense organs: Secondary | ICD-10-CM | POA: Diagnosis not present

## 2024-08-04 DIAGNOSIS — H18831 Recurrent erosion of cornea, right eye: Secondary | ICD-10-CM | POA: Diagnosis not present

## 2024-08-04 DIAGNOSIS — H18593 Other hereditary corneal dystrophies, bilateral: Secondary | ICD-10-CM | POA: Diagnosis not present

## 2024-08-11 DIAGNOSIS — H18833 Recurrent erosion of cornea, bilateral: Secondary | ICD-10-CM | POA: Diagnosis not present

## 2024-09-06 NOTE — Progress Notes (Signed)
 "    HPI: FU atrial fibrillation.  Patient seen in the emergency room December 2018 with atrial fibrillation.  She converted to sinus rhythm spontaneously.  Patient treated with Cardizem  and Xarelto .  TSH was normal.  Seen by Dr. Ladona and nuclear study January 2019 showed no ischemia.  Patient felt to have retinal artery occlusion June 2025.  Carotid Dopplers June 2025 showed 1 to 39% bilateral stenosis.  TTE obtained on 02/15/2024 showed EF 65 to 70%, moderate mitral annular calcification, mild MR, injection of agitated saline showed no right-to-left shunt.  Neurology recommended changing Xarelto  to Eliquis .  Since last seen she denies dyspnea, chest pain, palpitations or syncope.  No bleeding.  Current Outpatient Medications  Medication Sig Dispense Refill   acetaminophen  (TYLENOL  8 HOUR ARTHRITIS PAIN) 650 MG CR tablet Take 650 mg by mouth every 8 (eight) hours as needed for pain.     Carboxymethylcellulose Sod PF (REFRESH PLUS) 0.5 % SOLN daily at 6 (six) AM.     cholecalciferol (VITAMIN D) 1000 UNITS tablet Take 1,000 Units by mouth daily.      ELIQUIS  5 MG TABS tablet Take 1 tablet (5 mg total) by mouth 2 (two) times daily. 60 tablet 6   Famotidine  (PEPCID  PO) Take 1 tablet by mouth daily as needed.      hydrochlorothiazide (HYDRODIURIL) 12.5 MG tablet Take 12.5 mg by mouth daily.     Lidocaine-Menthol (ICY HOT MAX LIDOCAINE) 4-1 % CREA      LORazepam (ATIVAN) 0.5 MG tablet Take by mouth as needed for anxiety.     Polyethyl Glycol-Propyl Glycol (GENTEAL TEARS SEVERE DAY/NIGHT) 0.4-0.3 % GEL ophthalmic gel      polyethylene glycol powder (GLYCOLAX/MIRALAX) 17 GM/SCOOP powder Take by mouth as needed.     potassium chloride  (KLOR-CON ) 20 MEQ packet Take 20 mEq by mouth daily.     Psyllium (GERI-MUCIL) 68 % POWD Take by mouth daily at 6 (six) AM.     rosuvastatin (CRESTOR) 5 MG tablet Take 5 mg by mouth daily.     sertraline (ZOLOFT) 50 MG tablet Take 50 mg by mouth daily.     sodium chloride   (MURO 128) 5 % ophthalmic solution at bedtime.     Vibegron (GEMTESA) 75 MG TABS Take by mouth.     diltiazem  (CARDIZEM ) 30 MG tablet TAKE 1 TABELT EVERY 4 HOURS AS NEEDED FOR AFIB HEART RATE OVER 100. (Patient not taking: Reported on 09/16/2024) 180 tablet 3   ezetimibe  (ZETIA ) 10 MG tablet Take 1 tablet (10 mg total) by mouth daily. (Patient not taking: Reported on 09/16/2024) 90 tablet 3   hydrocortisone  (ANUSOL -HC) 2.5 % rectal cream Place 1 Application rectally at bedtime. (Patient not taking: Reported on 09/16/2024) 30 g 1   No current facility-administered medications for this visit.     Past Medical History:  Diagnosis Date   Allergy    occasionally    Anxiety    closterphobia   Arthritis    knees and thumbs    Cataract    bilaterally and removed   Colon polyps    Eye pressure    elevated eye pressure because of thickened cornea but monitoring for glaucona   GERD (gastroesophageal reflux disease)    Hyperlipidemia    Hypertension    IBS (irritable bowel syndrome)    Mini stroke    Mini stroke on the macular of left eye   Osteopenia    Other fracture of right great toe, subsequent encounter for fracture  with routine healing    Sciatica     Past Surgical History:  Procedure Laterality Date   APPENDECTOMY     COLONOSCOPY     DILATION AND CURETTAGE OF UTERUS     x 6    ENDOMETRIAL ABLATION     EXPLORATORY LAPAROTOMY     POLYPECTOMY     TONSILLECTOMY      Social History   Socioeconomic History   Marital status: Widowed    Spouse name: Not on file   Number of children: 1   Years of education: Not on file   Highest education level: Not on file  Occupational History   Occupation: retired  Tobacco Use   Smoking status: Never   Smokeless tobacco: Never  Vaping Use   Vaping status: Never Used  Substance and Sexual Activity   Alcohol use: No   Drug use: No   Sexual activity: Not on file  Other Topics Concern   Not on file  Social History Narrative   Not on  file   Social Drivers of Health   Tobacco Use: Low Risk (09/16/2024)   Patient History    Smoking Tobacco Use: Never    Smokeless Tobacco Use: Never    Passive Exposure: Not on file  Financial Resource Strain: Low Risk (02/20/2024)   Overall Financial Resource Strain (CARDIA)    Difficulty of Paying Living Expenses: Not hard at all  Food Insecurity: No Food Insecurity (02/20/2024)   Epic    Worried About Radiation Protection Practitioner of Food in the Last Year: Never true    Ran Out of Food in the Last Year: Never true  Transportation Needs: No Transportation Needs (02/20/2024)   Epic    Lack of Transportation (Medical): No    Lack of Transportation (Non-Medical): No  Physical Activity: Not on file  Stress: Not on file  Social Connections: Not on file  Intimate Partner Violence: Not on file  Depression (EYV7-0): Not on file  Alcohol Screen: Not on file  Housing: Low Risk (02/20/2024)   Epic    Unable to Pay for Housing in the Last Year: No    Number of Times Moved in the Last Year: 0    Homeless in the Last Year: No  Utilities: Not At Risk (02/20/2024)   Epic    Threatened with loss of utilities: No  Health Literacy: Not on file    Family History  Problem Relation Age of Onset   Dementia Mother    Cancer - Lung Brother    Dementia Brother    Colon polyps Brother    Colon cancer Neg Hx    Rectal cancer Neg Hx    Stomach cancer Neg Hx     ROS: no fevers or chills, productive cough, hemoptysis, dysphasia, odynophagia, melena, hematochezia, dysuria, hematuria, rash, seizure activity, orthopnea, PND, pedal edema, claudication. Remaining systems are negative.  Physical Exam: Well-developed well-nourished in no acute distress.  Skin is warm and dry.  HEENT is normal.  Neck is supple.  Chest is clear to auscultation with normal expansion.  Cardiovascular exam is regular rate and rhythm.  Abdominal exam nontender or distended. No masses palpated. Extremities show no edema. neuro grossly  intact  A/P  1 paroxysmal atrial fibrillation-patient remains in sinus rhythm.  Continue Cardizem  as needed as well as apixaban .     2 hypertension-BP mildly elevated; however typically controlled.  Continue present medications and follow.   3 palpitations-No recent symptoms.   4 hyperlipidemia- She did not  tolerate statins previously.  She has been given a prescription for Zetia  and will initiate this and we will check lipids in 8 weeks.  She does not want to consider PCSK9 inhibitors at this point.  5 history of retinal artery occlusion-previous evaluation unrevealing.  Xarelto  changed to apixaban .  Redell Shallow, MD    "

## 2024-09-09 ENCOUNTER — Other Ambulatory Visit: Payer: Self-pay | Admitting: Cardiology

## 2024-09-16 ENCOUNTER — Ambulatory Visit: Attending: Cardiology | Admitting: Cardiology

## 2024-09-16 ENCOUNTER — Encounter: Payer: Self-pay | Admitting: Cardiology

## 2024-09-16 VITALS — BP 144/84 | HR 62 | Ht 63.0 in | Wt 220.9 lb

## 2024-09-16 DIAGNOSIS — E7800 Pure hypercholesterolemia, unspecified: Secondary | ICD-10-CM | POA: Diagnosis not present

## 2024-09-16 DIAGNOSIS — I10 Essential (primary) hypertension: Secondary | ICD-10-CM

## 2024-09-16 DIAGNOSIS — I480 Paroxysmal atrial fibrillation: Secondary | ICD-10-CM

## 2024-09-16 DIAGNOSIS — H34232 Retinal artery branch occlusion, left eye: Secondary | ICD-10-CM

## 2024-09-16 NOTE — Patient Instructions (Signed)
 Medication Instructions:  Please start your Ezetimibe  (Zetia ) 10 mg, one tablet, once daily  *If you need a refill on your cardiac medications before your next appointment, please call your pharmacy*  Lab Work: None  Follow-Up: At [facility], you and your health needs are our priority.  As part of our continuing mission to provide you with exceptional heart care, our providers are all part of one team.  This team includes your primary Cardiologist (physician) and Advanced Practice Providers or APPs (Physician Assistants and Nurse Practitioners) who all work together to provide you with the care you need, when you need it.  Your next appointment:   1 year(s)  Provider:   Redell Shallow, MD

## 2024-09-23 LAB — LAB REPORT - SCANNED: EGFR: 73
